# Patient Record
Sex: Female | Born: 1985 | Race: White | Hispanic: No | Marital: Married | Smoking: Never smoker
Health system: Southern US, Community
[De-identification: ages and names within clinical notes are randomized; demographics above are authoritative.]

## PROBLEM LIST (undated history)

## (undated) DIAGNOSIS — F329 Major depressive disorder, single episode, unspecified: Secondary | ICD-10-CM

## (undated) DIAGNOSIS — IMO0002 Reserved for concepts with insufficient information to code with codable children: Secondary | ICD-10-CM

## (undated) DIAGNOSIS — R87619 Unspecified abnormal cytological findings in specimens from cervix uteri: Secondary | ICD-10-CM

## (undated) DIAGNOSIS — K219 Gastro-esophageal reflux disease without esophagitis: Secondary | ICD-10-CM

## (undated) DIAGNOSIS — E282 Polycystic ovarian syndrome: Secondary | ICD-10-CM

## (undated) DIAGNOSIS — F32A Depression, unspecified: Secondary | ICD-10-CM

## (undated) HISTORY — PX: GYNECOLOGIC CRYOSURGERY: SHX857

## (undated) HISTORY — PX: WISDOM TOOTH EXTRACTION: SHX21

## (undated) HISTORY — PX: ADENOIDECTOMY: SUR15

## (undated) HISTORY — PX: TYMPANOSTOMY TUBE PLACEMENT: SHX32

## (undated) HISTORY — PX: TONSILLECTOMY: SUR1361

---
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
## 2003-02-23 ENCOUNTER — Other Ambulatory Visit: Admission: RE | Admit: 2003-02-23 | Discharge: 2003-02-23 | Payer: Self-pay | Admitting: Obstetrics and Gynecology

## 2003-06-17 ENCOUNTER — Emergency Department (HOSPITAL_COMMUNITY): Admission: EM | Admit: 2003-06-17 | Discharge: 2003-06-18 | Payer: Self-pay | Admitting: Emergency Medicine

## 2003-06-18 ENCOUNTER — Inpatient Hospital Stay (HOSPITAL_COMMUNITY): Admission: AD | Admit: 2003-06-18 | Discharge: 2003-06-21 | Payer: Self-pay | Admitting: Psychiatry

## 2003-09-06 ENCOUNTER — Other Ambulatory Visit: Admission: RE | Admit: 2003-09-06 | Discharge: 2003-09-06 | Payer: Self-pay | Admitting: Obstetrics and Gynecology

## 2003-11-09 ENCOUNTER — Other Ambulatory Visit: Admission: RE | Admit: 2003-11-09 | Discharge: 2003-11-09 | Payer: Self-pay | Admitting: Obstetrics and Gynecology

## 2004-07-17 ENCOUNTER — Other Ambulatory Visit: Admission: RE | Admit: 2004-07-17 | Discharge: 2004-07-17 | Payer: Self-pay | Admitting: Obstetrics and Gynecology

## 2004-08-28 ENCOUNTER — Other Ambulatory Visit: Admission: RE | Admit: 2004-08-28 | Discharge: 2004-08-28 | Payer: Self-pay | Admitting: Obstetrics and Gynecology

## 2004-11-11 ENCOUNTER — Other Ambulatory Visit: Admission: RE | Admit: 2004-11-11 | Discharge: 2004-11-11 | Payer: Self-pay | Admitting: Obstetrics and Gynecology

## 2005-03-25 ENCOUNTER — Other Ambulatory Visit: Admission: RE | Admit: 2005-03-25 | Discharge: 2005-03-25 | Payer: Self-pay | Admitting: Obstetrics and Gynecology

## 2005-06-10 ENCOUNTER — Other Ambulatory Visit: Admission: RE | Admit: 2005-06-10 | Discharge: 2005-06-10 | Payer: Self-pay | Admitting: Obstetrics and Gynecology

## 2005-11-17 ENCOUNTER — Other Ambulatory Visit: Admission: RE | Admit: 2005-11-17 | Discharge: 2005-11-17 | Payer: Self-pay | Admitting: Obstetrics & Gynecology

## 2006-11-09 ENCOUNTER — Other Ambulatory Visit: Admission: RE | Admit: 2006-11-09 | Discharge: 2006-11-09 | Payer: Self-pay | Admitting: Obstetrics and Gynecology

## 2007-05-23 ENCOUNTER — Other Ambulatory Visit: Admission: RE | Admit: 2007-05-23 | Discharge: 2007-05-23 | Payer: Self-pay | Admitting: Obstetrics and Gynecology

## 2007-12-14 ENCOUNTER — Ambulatory Visit (HOSPITAL_COMMUNITY): Admission: RE | Admit: 2007-12-14 | Discharge: 2007-12-14 | Payer: Self-pay | Admitting: Obstetrics and Gynecology

## 2008-05-12 ENCOUNTER — Inpatient Hospital Stay (HOSPITAL_COMMUNITY): Admission: AD | Admit: 2008-05-12 | Discharge: 2008-05-13 | Payer: Self-pay | Admitting: Obstetrics and Gynecology

## 2008-06-02 ENCOUNTER — Inpatient Hospital Stay (HOSPITAL_COMMUNITY): Admission: AD | Admit: 2008-06-02 | Discharge: 2008-06-04 | Payer: Self-pay | Admitting: Obstetrics and Gynecology

## 2008-07-12 ENCOUNTER — Ambulatory Visit (HOSPITAL_COMMUNITY): Admission: RE | Admit: 2008-07-12 | Discharge: 2008-07-12 | Payer: Self-pay | Admitting: *Deleted

## 2010-01-12 ENCOUNTER — Emergency Department (HOSPITAL_COMMUNITY): Admission: EM | Admit: 2010-01-12 | Discharge: 2010-01-12 | Payer: Self-pay | Admitting: Emergency Medicine

## 2010-12-16 NOTE — Op Note (Signed)
Jennifer Gamble, Jennifer Gamble            ACCOUNT NO.:  [id number]   MEDICAL RECORD NO.:  [id number]          PATIENT TYPE:  AMB   LOCATION:  ENDO                         FACILITY:  [facility]   PHYSICIAN:  Georgiana Spinner, M.D.    DATE OF BIRTH:  [birth year]   DATE OF PROCEDURE:  07/12/2008  DATE OF DISCHARGE:                               OPERATIVE REPORT   PROCEDURE:  Upper endoscopy.   INDICATIONS:  Abdominal pain.   ANESTHESIA:  Fentanyl 100 mcg, Versed 7 mg.   PROCEDURE IN DETAIL:  With the patient mildly sedated in the left  lateral decubitus position the Pentax videoscopic endoscope was inserted  in the mouth, passed under direct vision through the esophagus which  appeared normal into the stomach, fundus, body, antrum, duodenal bulb,  second portion duodenum appeared normal.  From this point the endoscope  was slowly withdrawn taking circumferential views of duodenal mucosa  until the endoscope had been pulled back into stomach and placed in  retroflexion to view the stomach from below.  The endoscope was  straightened and withdrawn taking circumferential views of remaining  gastric and esophageal mucosa.  The patient's vital signs, pulse  oximeter remained stable.  The patient tolerated procedure well without  apparent complications.   FINDINGS:  Negative examination.   PLAN:  Have the patient follow up with me as an outpatient.           ______________________________  Georgiana Spinner, M.D.     GMO/MEDQ  D:  07/12/2008  T:  07/12/2008  Job:  [id number]

## 2010-12-16 NOTE — Discharge Summary (Signed)
Jennifer Gamble, Jennifer Gamble            ACCOUNT NO.:  [id number]   MEDICAL RECORD NO.:  [id number]          PATIENT TYPE:  INP   LOCATION:  9140                          FACILITY:  [facility]   PHYSICIAN:  Gerrit Friends. Aldona Bar, M.D.   DATE OF BIRTH:  October 17, 1985   DATE OF ADMISSION:  06/02/2008  DATE OF DISCHARGE:  06/04/2008                               DISCHARGE SUMMARY   DISCHARGE DIAGNOSES:  1. A 37-week intrauterine pregnancy delivered viable 7 pounds 1 ounce      female infant, Apgars 6 and 7.  2. Blood type O positive.  3. Positive group B strep.  4. Viral syndrome.   PROCEDURES:  1. Intrapartum antibiotics.  2. Normal spontaneous delivery.  3. Midline episiotomy repair.   SUMMARY:  This 25 year old primigravida was admitted at 39-37 weeks'  gestation with fever and flu-like symptoms having previously been  started on Tamiflu as an outpatient.  She presented with spontaneous  rupture of membranes and some upper respiratory infection symptoms as  well as fever and chills.  She was monitored throughout labor and  subsequently delivered by normal spontaneous delivery of viable female  infant with Apgar of 6 and  - baby weighed 7 pounds 1 ounce and  initially had some grunting, but otherwise subsequently did well.  She  did receive intrapartum penicillin for positive group B strep while on  labor.   Her postpartum course was relatively benign.  She remained afebrile  postpartum and baby likewise did well.  On the morning of June 04, 2008, both mother and baby were doing well.  Baby was circumcised and  pending discharge and mother was discharged as well.  Discharge  hemoglobin on June 03, 2008, was 12.4 with a white count of 8400,  and platelet count of 208,000.   The patient was given a discharge brochure at the time of discharge and  understood all instructions well.  She will finish up her vitamins.  She  will continue on her Zoloft 100 mg daily as well as Tamiflu 75 mg twice  daily until gone.  She was also given a prescription for Motrin 600 mg  to use every 6 hours as needed for cramping or pain.  She was given  instruction brochure at the time of discharge and understood all  instructions well.   CONDITION ON DISCHARGE:  Improved.      Gerrit Friends. Aldona Bar, M.D.  Electronically Signed     RMW/MEDQ  D:  06/04/2008  T:  06/04/2008  Job:  [id number]

## 2010-12-19 NOTE — Discharge Summary (Signed)
NAMESHACOLA, SCHUSSLER                        ACCOUNT NO.:  [id number]   MEDICAL RECORD NO.:  [id number]                   PATIENT TYPE:  IPS   LOCATION:  0305                                 FACILITY:  [facility]   PHYSICIAN:  Geoffery Lyons, M.D.                   DATE OF BIRTH:  [birth year]   DATE OF ADMISSION:  06/18/2003  DATE OF DISCHARGE:  06/21/2003                                 DISCHARGE SUMMARY   CHIEF COMPLAINT AND PRESENT ILLNESS:  This was the first admission to [facility] for this 25 year old single white female  voluntarily admitted.  History of intentional overdose, overdosing on 800-  900 mg of Seroquel tablets.  She was at home, it was an impulsive act.  After her overdose she felt very scared and called EMS.  Increasing mood  swings.  Feels very hopeless, irritated state, empty feeling, issues with  her boyfriend.  Diagnosed with bipolar disorder.  Normally sleeps well when  she takes the Seroquel.  Some carbohydrate cravings and sweets.   PAST PSYCHIATRIC HISTORY:  First time hospitalized.  No history of other  suicide attempt.  She saw Dr. Onalee Hua L. Toni Arthurs.  History of bipolar disorder.   ALCOHOL AND DRUG HISTORY:  Denies the use or abuse of any substances.   PAST MEDICAL HISTORY:  Noncontributory.   MEDICATIONS:  1. Lamictal 150 for the past 2 months.  2. Albuterol inhaler.  3. Seroquel 25 at night.  4. Lexapro 20 per day.   PHYSICAL EXAMINATION:  Performed and failed show any acute findings.   MENTAL STATUS EXAM:  Reveals an alert young cooperative female casually  dressed, fair eye contact.  Speech was clear.  She felt ashamed and  embarrassed.  Agreeable and somewhat sad.  Thought processes were coherent.  Goal-directed.  No evidence of psychosis.  Cognition well preserved.   ADMISSION DIAGNOSIS:   AXIS I:  Rule out bipolar disorder.   AXIS II:  No diagnosis.   AXIS III:  No diagnosis.   AXIS IV:  Moderate.   AXIS V:  Global  assessment of functioning upon admission  35; highest global  assessment of functioning in the last year:  65-70.   LABORATORY WORK-UP:  Sodium 138.  Potassium 3.4.  glucose 138.  Alcohol  level less than 5.  Urine pregnant test negative.  Urine drug screen  negative for substances of abuse.   HOSPITAL COURSE:  She was admitted and started in intensive individual and  group psychotherapy.  She was maintained on Lexapro 50 mg per day, Lamictal  150 mg per day, Seroquel 25 daily, and every 4 hours as needed, Xanax 0.25  every 4 hours as needed.  She was preclude on albuterol and was she was  given Ambien for sleep.  She endorsed mood swings.  She had a bad response  to Paxil with increase in the mood  swings.  Family history of bipolar  disorder.  Still with episodes of irritability and impulsivity and racing  thoughts.  We went ahead and discussed options and we started lithium 300 mg  twice a day.  This was increased up to 900 mg per day.  She was concerned  about getting behind in school being that she is missing school because of  being in the hospital.  Mood was not so good.  Having a hard time being  apart from the family.  Denied any suicidal or homicidal ideation.   On June 21, 2003 she was in full contact with reality.  No suicidal  ideation.  No homicidal ideation.  No delusions.  No hallucinations.  Feeling much better.  Actually well.  No mood swings.  Slept well.  Willing to pursue further outpatient treatments.  So, we went ahead and  discharged to outpatient followup.   DISCHARGE DIAGNOSES:   AXIS I:  1. Bipolar disorder.  2. Depression.   AXIS II:  No diagnosis.   AXIS III:  No diagnosis.   AXIS IV:  Moderate.   AXIS V:  Global assessment of functioning on discharge:  55.   DISCHARGE MEDICATIONS:  1. Lexapro 50 mg per day.  2. Advair inhaler.  3. Lamictal 175 mg daily.  4. Seroquel 50 mg at night.  5. Lithium carbonate 300 mg three times a day.  6.  Ambien 10 at bedtime for sleep.   FOLLOW UP:  Dr. Toni Arthurs.                                               Geoffery Lyons, M.D.    IL/MEDQ  D:  07/11/2003  T:  07/12/2003  Job:  [id number]

## 2010-12-19 NOTE — H&P (Signed)
NAMECORBY, VILLASENOR                        ACCOUNT NO.:  [id number]   MEDICAL RECORD NO.:  [id number]                   PATIENT TYPE:  IPS   LOCATION:  0305                                 [facility]ILITY[facility]   PHYSICIAN:  Geoffery Lyons, M.D.                   DATE OF BIRTH:  1986/01/20   DATE OF ADMISSION:  06/18/2003  DATE OF DISCHARGE:                         PSYCHIATRIC ADMISSION ASSESSMENT   IDENTIFYING INFORMATION:  The patient is a 25 year old single white female  voluntarily admitted on June 18, 2003.   HISTORY OF PRESENT ILLNESS:  The patient presents with a history of  intentional overdose, overdosing on eight to nine 100 mg Seroquel tablets.  She was at home.  She states it was an impulsive act.  She states after she  overdosed she felt very scared and called EMS.  The patient reports having  increasing mood swings, feels very hopeless, irritated, sad, empty feeling.  Her stressors include issues with a boyfriend, recently diagnosed with  bipolar disorder.  The patient states she normally sleeps well when she  takes Seroquel.  Her appetite: The patient reports some cravings with  carbohydrates and sweets.  She denies hallucinations.  She denies any  current suicide attempt at this time.   PAST PSYCHIATRIC HISTORY:  This is the first hospitalization to [facility].  No history of a suicide attempt.  No other psychiatric  admissions.  She sees Onalee Hua L. Toni Arthurs, M.D., a psychologist in [place].  She has a history of bipolar disorder.   SUBSTANCE ABUSE HISTORY:  The patient is a nonsmoker.  She denies any  alcohol or drug use.   PAST MEDICAL HISTORY:  Primary care Aizlyn Schifano: Dr. Rebecca Eaton in [place].  Medical problems: Asthma.   MEDICATIONS:  1. She has been Lamictal 150 mg for the past two months.  2. Albuterol inhaler.  3. Seroquel 25 mg at h.s.; has been on that for approximately five months.  4. Lexapro 20 mg; has been on it for four months.   She  has been off her medications for three to four days.   DRUG ALLERGIES:  No known allergies.   PHYSICAL EXAMINATION:  GENERAL:  Physical examination was done at [facility], which was reviewed with no significant findings.  The patient is a well nourished female in no acute distress.  VITAL SIGNS:  Vital signs are stable at temperature 97.8, heart rate 83,  respirations 18, blood pressure 120/83.  She is 5 feet 5 inches tall.  She  156 pounds.   LABORATORY DATA:  Sodium 138, potassium 3.4, glucose 134.  Alcohol level:  Less than 5.  Acetaminophen level: Less than 10.  Salicylate level: Less  than 4.  Urine pregnancy test was negative.  Urine drug screen was negative.   SOCIAL HISTORY:  She is a 25 year old single African-American female with no  children.  She lives with her  parents and her brother.  No legal problems.  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  She is currently working in  an Educational psychologist clinic as a Scientist, physiological.   FAMILY HISTORY:  Her 46 year old brother has a history of bipolar disorder,  was diagnosed two years ago.  He is currently on lithium and Seroquel.   MENTAL STATUS EXAM:  She is an alert, young cooperative female, casually  dressed, fair eye contact.  Speech is clear.  The patient feels very ashamed  and embarrassed.  The patient is agreeable and somewhat sad.  Thought  processes are coherent; goal directed; no evidence of psychosis.  Cognitive  functioning: Intact.  Memory is fair.  Judgment is fair.  Poor impulse  control.   ADMISSION DIAGNOSES:   AXIS I:  Rule out bipolar disorder.   AXIS II:  Deferred.   AXIS III:  None.   AXIS IV:  Problems with education and other psychosocial problems related to  recent psychiatric illness.   AXIS V:  Current is 35, this past year is 65-70.   INITIAL PLAN OF CARE:  Plan is a voluntary admission to [facility] for intentional overdose.  Contract for safety, check every  15  minutes.  Stabilize mood and thinking so the patient can be safe and  functional.  Will decrease Lexapro as it may have added to the patient's  impulsivity.  Continue to educate the patient on diagnosis and medications.  The patient is to be medication compliant.  The patient is to follow up with  Onalee Hua L. Toni Arthurs, M.D.  Will consider a family session with parents prior to  discharge.   ESTIMATED LENGTH OF STAY:  Three to five days.     Landry Corporal, N.P.                       Geoffery Lyons, M.D.    JO/MEDQ  D:  06/19/2003  T:  06/19/2003  Job:  [id number]

## 2011-04-07 ENCOUNTER — Ambulatory Visit (HOSPITAL_BASED_OUTPATIENT_CLINIC_OR_DEPARTMENT_OTHER): Admission: RE | Admit: 2011-04-07 | Payer: 59 | Source: Ambulatory Visit | Admitting: Otolaryngology

## 2011-05-05 LAB — CBC
Hemoglobin: 11.9 — ABNORMAL LOW
Hemoglobin: 12.4
MCHC: 32.6
MCV: 87.3
RBC: 4.1
RBC: 4.35
RDW: 14.4

## 2011-06-08 ENCOUNTER — Inpatient Hospital Stay (HOSPITAL_COMMUNITY)
Admission: AD | Admit: 2011-06-08 | Discharge: 2011-06-08 | Disposition: A | Discharge status: Home / Self Care | Payer: PRIVATE HEALTH INSURANCE | Source: Ambulatory Visit | Attending: Obstetrics and Gynecology | Admitting: Obstetrics and Gynecology

## 2011-06-08 ENCOUNTER — Inpatient Hospital Stay (HOSPITAL_COMMUNITY): Payer: PRIVATE HEALTH INSURANCE

## 2011-06-08 ENCOUNTER — Encounter (HOSPITAL_COMMUNITY): Payer: Self-pay | Admitting: *Deleted

## 2011-06-08 DIAGNOSIS — N949 Unspecified condition associated with female genital organs and menstrual cycle: Secondary | ICD-10-CM

## 2011-06-08 DIAGNOSIS — R102 Pelvic and perineal pain: Secondary | ICD-10-CM

## 2011-06-08 DIAGNOSIS — N390 Urinary tract infection, site not specified: Secondary | ICD-10-CM

## 2011-06-08 HISTORY — DX: Reserved for concepts with insufficient information to code with codable children: IMO0002

## 2011-06-08 HISTORY — DX: Gastro-esophageal reflux disease without esophagitis: K21.9

## 2011-06-08 HISTORY — DX: Major depressive disorder, single episode, unspecified: F32.9

## 2011-06-08 HISTORY — DX: Unspecified abnormal cytological findings in specimens from cervix uteri: R87.619

## 2011-06-08 HISTORY — DX: Depression, unspecified: F32.A

## 2011-06-08 HISTORY — DX: Polycystic ovarian syndrome: E28.2

## 2011-06-08 LAB — URINALYSIS, ROUTINE W REFLEX MICROSCOPIC
Glucose, UA: NEGATIVE mg/dL
Hgb urine dipstick: NEGATIVE
Ketones, ur: NEGATIVE mg/dL
Protein, ur: NEGATIVE mg/dL
Urobilinogen, UA: 0.2 mg/dL (ref 0.0–1.0)

## 2011-06-08 LAB — URINE MICROSCOPIC-ADD ON

## 2011-06-08 LAB — WET PREP, GENITAL
Clue Cells Wet Prep HPF POC: NONE SEEN
Trich, Wet Prep: NONE SEEN

## 2011-06-08 MED ORDER — SULFAMETHOXAZOLE-TRIMETHOPRIM 800-160 MG PO TABS: 1.0000 | ORAL_TABLET | Freq: Two times a day (BID) | ORAL | Status: AC

## 2011-06-08 MED ORDER — KETOROLAC TROMETHAMINE 60 MG/2ML IM SOLN
60.0000 mg | Freq: Once | INTRAMUSCULAR | Status: AC
  Administered 2011-06-08: 60 mg via INTRAMUSCULAR
  Filled 2011-06-08: qty 2

## 2011-06-08 NOTE — Progress Notes (Signed)
Fri night during petting, had sharp vag pains.  Cont over the weekend, abd feels bloated and has tender. Vag pain continues. Hurts to sit, radiates, very tender to touch.  Feels similar to when had a uterine infection after IUD placed.

## 2011-06-08 NOTE — ED Provider Notes (Signed)
History     CSN: [id number] Arrival date & time: 06/08/2011 11:20 AM   None     Chief Complaint  Patient presents with  . Vaginal Pain    HPI Jennifer Gamble is a 25 y.o. female who presents to [facility] for vaginal pain that started 3 days ago when having sex and has continued. Also having lower abdominal cramping. Had pelvic infection when had IUD and this pain in the lower abdomen feels similar to that. Last sexual intercourse before 3 days ago was 3 months before that. Denies discharge or bleeding. Condoms for birth control, LMP 05/23/11. The history was provided by the patient.  Past Medical History  Diagnosis Date  . Ulcer   . Acid reflux   . Depression   . Abnormal Pap smear     CIN II  . Polycystic ovarian disease     Past Surgical History  Procedure Date  . Gynecologic cryosurgery   . Wisdom tooth extraction     Family History  Problem Relation Age of Onset  . Diabetes Mother   . Hypertension Mother     History  Substance Use Topics  . Smoking status: Never Smoker   . Smokeless tobacco: Never Used  . Alcohol Use: 1.0 oz/week    2 drink(s) per week    OB History    Grav Para Term Preterm Abortions TAB SAB Ect Mult Living   2 1 1  0 1 1 0 0 0 1      Review of Systems  Constitutional: Positive for chills. Negative for fever, diaphoresis and fatigue.  HENT: Negative for ear pain, congestion, sore throat, facial swelling, neck pain, neck stiffness, dental problem and sinus pressure.   Eyes: Negative for photophobia, pain and discharge.  Respiratory: Negative for cough, chest tightness and wheezing.   Cardiovascular: Negative.   Gastrointestinal: Positive for nausea, abdominal pain and abdominal distention. Negative for vomiting, diarrhea and constipation.  Genitourinary: Positive for vaginal bleeding. Negative for dysuria, frequency, flank pain, vaginal discharge and difficulty urinating.  Musculoskeletal: Positive for back pain. Negative for myalgias and  gait problem.  Skin: Negative for color change and rash.  Neurological: Positive for light-headedness. Negative for dizziness, speech difficulty, weakness, numbness and headaches.  Psychiatric/Behavioral: Negative for confusion and agitation. The patient is not nervous/anxious.        Depression    Allergies  Review of patient's allergies indicates no known allergies.  Home Medications  No current outpatient prescriptions on file.  BP 119/87  Pulse 77  Temp(Src) 98.5 F (36.9 C) (Oral)  Resp 20  LMP 05/18/2011  Physical Exam  Nursing note and vitals reviewed. Constitutional: She is oriented to person, place, and time. She appears well-developed and well-nourished.  HENT:  Head: Normocephalic.  Eyes: EOM are normal.  Neck: Neck supple.  Cardiovascular: Normal rate.   Pulmonary/Chest: Effort normal.  Abdominal: Soft. There is tenderness.       Mildly tender RLQ without guarding or rebound.  Genitourinary:       External genitalia without lesions. Small amount of white vaginal discharge. Right adnexal tenderness, uterus without palpable enlargement.  Musculoskeletal: Normal range of motion.  Neurological: She is alert and oriented to person, place, and time. No cranial nerve deficit.  Skin: Skin is warm and dry.  Psychiatric: Judgment and thought content normal. Her mood appears anxious.   Results for orders placed during the hospital encounter of 06/08/11 (from the past 24 hour(s))  URINALYSIS, ROUTINE W REFLEX MICROSCOPIC  Status: Abnormal   Collection Time   06/08/11 12:25 PM      Component Value Range   Color, Urine YELLOW  YELLOW    Appearance CLOUDY (*) CLEAR    Specific Gravity, Urine 1.025  1.005 - 1.030    pH 6.0  5.0 - 8.0    Glucose, UA NEGATIVE  NEGATIVE (mg/dL)   Hgb urine dipstick NEGATIVE  NEGATIVE    Bilirubin Urine NEGATIVE  NEGATIVE    Ketones, ur NEGATIVE  NEGATIVE (mg/dL)   Protein, ur NEGATIVE  NEGATIVE (mg/dL)   Urobilinogen, UA 0.2  0.0 - 1.0  (mg/dL)   Nitrite POSITIVE (*) NEGATIVE    Leukocytes, UA NEGATIVE  NEGATIVE   URINE MICROSCOPIC-ADD ON     Status: Abnormal   Collection Time   06/08/11 12:25 PM      Component Value Range   Squamous Epithelial / LPF FEW (*) RARE    WBC, UA 0-2  <3 (WBC/hpf)   Bacteria, UA MANY (*) RARE    Urine-Other MUCOUS PRESENT    WET PREP, GENITAL     Status: Abnormal   Collection Time   06/08/11 12:26 PM      Component Value Range   Yeast, Wet Prep NONE SEEN  NONE SEEN    Trich, Wet Prep NONE SEEN  NONE SEEN    Clue Cells, Wet Prep NONE SEEN  NONE SEEN    WBC, Wet Prep HPF POC FEW (*) NONE SEEN   POCT PREGNANCY, URINE     Status: Normal   Collection Time   06/08/11  2:49 PM      Component Value Range   Preg Test, Ur NEGATIVE     US Transvaginal Non-ob  06/08/2011  *RADIOLOGY REPORT*  Clinical Data: Pelvic pain.  Pelvic fullness.  No prior surgeries.  TRANSABDOMINAL AND TRANSVAGINAL ULTRASOUND OF PELVIS Technique:  Both transabdominal and transvaginal ultrasound examinations of the pelvis were performed. Transabdominal technique was performed for global imaging of the pelvis including uterus, ovaries, adnexal regions, and pelvic cul-de-sac.  Comparison: None.   It was necessary to proceed with endovaginal exam following the transabdominal exam to visualize the uterus and ovaries.  Findings:  Uterus: The uterus is 7.6 x 4.1 x 5.1 cm.  No masses identified.  Endometrium: The endometrium is 7.2 mm in thickness, normal in appearance.  Right ovary:  The right ovary is 3.0 x 2.3 x 2.1 cm. The ovary has a normal appearance.  Left ovary: The left ovary is 2.9 x 2.5 x 2.9 cm.  The ovary has a normal appearance.  Other findings: There is a small amount of free pelvic fluid.  IMPRESSION: Normal study. No evidence of pelvic mass or other significant abnormality.  Original Report Authenticated By: Patterson Hammersmith, M.D.   US Pelvis Complete  06/08/2011  *RADIOLOGY REPORT*  Clinical Data: Pelvic pain.  Pelvic  fullness.  No prior surgeries.  TRANSABDOMINAL AND TRANSVAGINAL ULTRASOUND OF PELVIS Technique:  Both transabdominal and transvaginal ultrasound examinations of the pelvis were performed. Transabdominal technique was performed for global imaging of the pelvis including uterus, ovaries, adnexal regions, and pelvic cul-de-sac.  Comparison: None.   It was necessary to proceed with endovaginal exam following the transabdominal exam to visualize the uterus and ovaries.  Findings:  Uterus: The uterus is 7.6 x 4.1 x 5.1 cm.  No masses identified.  Endometrium: The endometrium is 7.2 mm in thickness, normal in appearance.  Right ovary:  The right ovary is 3.0 x 2.3  x 2.1 cm. The ovary has a normal appearance.  Left ovary: The left ovary is 2.9 x 2.5 x 2.9 cm.  The ovary has a normal appearance.  Other findings: There is a small amount of free pelvic fluid.  IMPRESSION: Normal study. No evidence of pelvic mass or other significant abnormality.  Original Report Authenticated By: Patterson Hammersmith, M.D.   Assessment: UTI  Plan:  Antibiotics   Pyridium   Follow up with [facility]  ED Course  Procedures   MDM          Kerrie Buffalo, NP 06/10/11 [phone]

## 2011-06-08 NOTE — Progress Notes (Signed)
Denies urinary symptoms, no pain, burning, frequency or urgency.

## 2011-06-11 NOTE — ED Provider Notes (Signed)
Agree with above note.  Jennifer Gamble 06/11/2011 9:11 AM

## 2011-07-30 ENCOUNTER — Other Ambulatory Visit: Payer: Self-pay | Admitting: Internal Medicine

## 2011-07-30 ENCOUNTER — Ambulatory Visit
Admission: RE | Admit: 2011-07-30 | Discharge: 2011-07-30 | Disposition: A | Discharge status: Home / Self Care | Payer: PRIVATE HEALTH INSURANCE | Source: Ambulatory Visit | Attending: Internal Medicine | Admitting: Internal Medicine

## 2011-07-30 DIAGNOSIS — R102 Pelvic and perineal pain: Secondary | ICD-10-CM

## 2011-09-03 ENCOUNTER — Other Ambulatory Visit: Payer: Self-pay | Admitting: Obstetrics and Gynecology

## 2011-09-03 DIAGNOSIS — N6320 Unspecified lump in the left breast, unspecified quadrant: Secondary | ICD-10-CM

## 2011-09-10 ENCOUNTER — Ambulatory Visit
Admission: RE | Admit: 2011-09-10 | Discharge: 2011-09-10 | Disposition: A | Discharge status: Home / Self Care | Payer: Self-pay | Source: Ambulatory Visit | Attending: Obstetrics and Gynecology | Admitting: Obstetrics and Gynecology

## 2011-09-10 DIAGNOSIS — N6320 Unspecified lump in the left breast, unspecified quadrant: Secondary | ICD-10-CM

## 2012-03-28 ENCOUNTER — Encounter: Payer: Self-pay | Admitting: Obstetrics and Gynecology

## 2012-03-28 ENCOUNTER — Ambulatory Visit (INDEPENDENT_AMBULATORY_CARE_PROVIDER_SITE_OTHER): Payer: Self-pay | Admitting: Obstetrics and Gynecology

## 2012-03-28 VITALS — BP 120/70 | Wt 182.0 lb

## 2012-03-28 DIAGNOSIS — IMO0002 Reserved for concepts with insufficient information to code with codable children: Secondary | ICD-10-CM

## 2012-03-28 DIAGNOSIS — R87612 Low grade squamous intraepithelial lesion on cytologic smear of cervix (LGSIL): Secondary | ICD-10-CM

## 2012-03-28 NOTE — Progress Notes (Signed)
Pt is here for repeat pap. 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 Normal colpo 09/2011  REPEAT PAP VISIT  Subjective:    Jennifer Gamble is a 26 y.o. female who presents for a  repeat pap    Objective:    External genitalia: normal Perianal skin: no external genital warts noted Vagina: normal without discharge Cervix: normal in appearance  Assessment and Plan:   Abnormal pap or biopsy demonstrating LGSIL  Pap # 2/3 performed Return to the office in 4 months for repeat Pap until 3 consecutive normal Pap Smears Continue Folic Acid 1 mg daily  Anjanette Gilkey AMD 8/26/201311:49 AM

## 2012-03-30 LAB — PAP IG W/ RFLX HPV ASCU

## 2012-04-01 ENCOUNTER — Telehealth: Payer: Self-pay | Admitting: Obstetrics and Gynecology

## 2012-04-01 NOTE — Telephone Encounter (Signed)
LAURA/REROUTED TO SR CNA

## 2012-04-01 NOTE — Telephone Encounter (Signed)
sr pt 

## 2012-04-06 NOTE — Telephone Encounter (Signed)
Needs another option for Nuva ring please.  Will send chart to SR office.  ld

## 2012-04-11 NOTE — Telephone Encounter (Signed)
There is a discount on [email or web address] If not, need chart or other BCP pt has used in the past to make recommendations

## 2012-04-12 ENCOUNTER — Telehealth: Payer: Self-pay

## 2012-04-12 NOTE — Telephone Encounter (Signed)
LM for pt that a discount card is available on [email or web address].  Pt to call if that won't work for her.  ld

## 2012-07-20 ENCOUNTER — Encounter: Payer: Self-pay | Admitting: Obstetrics and Gynecology

## 2012-08-12 ENCOUNTER — Encounter: Payer: Self-pay | Admitting: Obstetrics and Gynecology

## 2012-08-30 ENCOUNTER — Ambulatory Visit: Payer: PRIVATE HEALTH INSURANCE | Admitting: Obstetrics and Gynecology

## 2012-08-30 ENCOUNTER — Encounter: Payer: Self-pay | Admitting: Obstetrics and Gynecology

## 2012-08-30 VITALS — BP 114/66 | Ht 65.0 in | Wt 185.0 lb

## 2012-08-30 DIAGNOSIS — Z0189 Encounter for other specified special examinations: Secondary | ICD-10-CM

## 2012-08-30 DIAGNOSIS — Z01419 Encounter for gynecological examination (general) (routine) without abnormal findings: Secondary | ICD-10-CM

## 2012-08-30 NOTE — Progress Notes (Signed)
History of previous cervical treatment:  Colpo Last pap showed:Normal     Date 03/28/2012 High Risk HPV present: No   Colposcopy results:Normal    Date:   09/2011  Gardasil received: yes  Offered:  no Tobacco use:  No Using Folic Acid: yes  The patient reports: c/o recurrent yeast infections. Has began using RePHresh x 6-7 weeks  Contraception:condoms  Last mammogram: not applicable * Last pap: approximate date 03/28/2013 and was normal   GC/Chlamydia cultures offered: declined HIV/RPR/HbsAg offered:  declined HSV 1 and 2 glycoprotein offered: declined  Menstrual cycle regular and monthly: Yes Menstrual flow normal: Yes  Urinary symptoms: none Normal bowel movements: Yes Reports abuse at home: No:  Subjective:    Jennifer Gamble is a 27 y.o. female, G2P1011, who presents for an annual exam.     History   Social History  . Marital Status: Legally Separated    Spouse Name: N/A    Number of Children: N/A  . Years of Education: N/A   Social History Main Topics  . Smoking status: Never Smoker   . Smokeless tobacco: Never Used  . Alcohol Use: 1.0 oz/week    2 drink(s) per week  . Drug Use: No  . Sexually Active: Yes    Birth Control/ Protection: Condom   Other Topics Concern  . None   Social History Narrative  . None    Menstrual cycle:   LMP: Patient's last menstrual period was 08/11/2012.           Cycle: Normal  The following portions of the patient's history were reviewed and updated as appropriate: allergies, current medications, past family history, past medical history, past social history, past surgical history and problem list.  Review of Systems Pertinent items are noted in HPI. Breast:Negative for breast lump,nipple discharge or nipple retraction Gastrointestinal: Negative for abdominal pain, change in bowel habits or rectal bleeding Urinary:negative   Objective:    BP 114/66  Ht 5\' 5"  (1.651 m)  Wt 185 lb (83.915 kg)  BMI 30.79 kg/m2  LMP  08/11/2012    Weight:  Wt Readings from Last 1 Encounters:  08/30/12 185 lb (83.915 kg)          BMI: Body mass index is 30.79 kg/(m^2).  General Appearance: Alert, appropriate appearance for age. No acute distress HEENT: Grossly normal Neck / Thyroid: Supple, no masses, nodes or enlargement Lungs: clear to auscultation bilaterally Back: No CVA tenderness Breast Exam: No dimpling, nipple retraction or discharge. No masses or nodes. and No masses or nodes.No dimpling, nipple retraction or discharge. Cardiovascular: Regular rate and rhythm. S1, S2, no murmur Gastrointestinal: Soft, non-tender, no masses or organomegaly Pelvic Exam: Vulva and vagina appear normal. Bimanual exam reveals normal uterus and adnexa. Rectovaginal: normal rectal, no masses Lymphatic Exam: Non-palpable nodes in neck, clavicular, axillary, or inguinal regions Skin: no rash or abnormalities Neurologic: Normal gait and speech, no tremor  Psychiatric: Alert and oriented, appropriate affect.   Assessment:    Normal gyn exam  LGSIL   Plan:    mammogram pap smear #3/3 return annually or prn STD screening: declined Contraception:condoms Evening Primrose Oil Reccommended    Silverio Lay MD

## 2012-08-31 LAB — PAP IG W/ RFLX HPV ASCU

## 2012-09-27 ENCOUNTER — Telehealth: Payer: Self-pay | Admitting: Obstetrics and Gynecology

## 2012-09-27 NOTE — Telephone Encounter (Signed)
Pt c/o yeast infection advised need appt pt has appt 09/28/12 at 2:00 with LC pt voice understanding

## 2012-09-27 NOTE — Telephone Encounter (Signed)
Lm on vm tcb rgd msg 

## 2012-09-28 ENCOUNTER — Encounter: Payer: PRIVATE HEALTH INSURANCE | Admitting: Family Medicine

## 2014-06-04 ENCOUNTER — Encounter: Payer: Self-pay | Admitting: Obstetrics and Gynecology

## 2015-11-05 DIAGNOSIS — M25561 Pain in right knee: Secondary | ICD-10-CM | POA: Diagnosis not present

## 2015-11-05 DIAGNOSIS — M2241 Chondromalacia patellae, right knee: Secondary | ICD-10-CM | POA: Diagnosis not present

## 2015-11-05 DIAGNOSIS — M6281 Muscle weakness (generalized): Secondary | ICD-10-CM | POA: Diagnosis not present

## 2015-11-14 DIAGNOSIS — J029 Acute pharyngitis, unspecified: Secondary | ICD-10-CM | POA: Diagnosis not present

## 2015-11-14 DIAGNOSIS — J069 Acute upper respiratory infection, unspecified: Secondary | ICD-10-CM | POA: Diagnosis not present

## 2016-01-16 DIAGNOSIS — N898 Other specified noninflammatory disorders of vagina: Secondary | ICD-10-CM | POA: Diagnosis not present

## 2016-01-16 DIAGNOSIS — N94810 Vulvar vestibulitis: Secondary | ICD-10-CM | POA: Diagnosis not present

## 2016-05-27 DIAGNOSIS — R51 Headache: Secondary | ICD-10-CM | POA: Diagnosis not present

## 2016-08-04 DIAGNOSIS — R1030 Lower abdominal pain, unspecified: Secondary | ICD-10-CM | POA: Diagnosis not present

## 2016-08-12 DIAGNOSIS — R1030 Lower abdominal pain, unspecified: Secondary | ICD-10-CM | POA: Diagnosis not present

## 2016-08-24 ENCOUNTER — Other Ambulatory Visit: Payer: Self-pay | Admitting: Internal Medicine

## 2016-08-24 DIAGNOSIS — R1030 Lower abdominal pain, unspecified: Secondary | ICD-10-CM

## 2016-08-25 ENCOUNTER — Other Ambulatory Visit: Payer: Self-pay | Admitting: Gastroenterology

## 2016-08-25 DIAGNOSIS — R140 Abdominal distension (gaseous): Secondary | ICD-10-CM | POA: Diagnosis not present

## 2016-08-25 DIAGNOSIS — R1032 Left lower quadrant pain: Secondary | ICD-10-CM

## 2016-08-25 DIAGNOSIS — R194 Change in bowel habit: Secondary | ICD-10-CM | POA: Diagnosis not present

## 2016-08-25 DIAGNOSIS — R1033 Periumbilical pain: Secondary | ICD-10-CM | POA: Diagnosis not present

## 2016-08-26 ENCOUNTER — Other Ambulatory Visit: Payer: BLUE CROSS/BLUE SHIELD

## 2016-08-26 ENCOUNTER — Ambulatory Visit
Admission: RE | Admit: 2016-08-26 | Discharge: 2016-08-26 | Disposition: A | Discharge status: Home / Self Care | Payer: BLUE CROSS/BLUE SHIELD | Source: Ambulatory Visit | Attending: Gastroenterology | Admitting: Gastroenterology

## 2016-08-26 DIAGNOSIS — R1032 Left lower quadrant pain: Secondary | ICD-10-CM | POA: Diagnosis not present

## 2016-08-26 MED ORDER — IOPAMIDOL (ISOVUE-300) INJECTION 61%
100.0000 mL | Freq: Once | INTRAVENOUS | Status: AC | PRN
  Administered 2016-08-26: 100 mL via INTRAVENOUS

## 2016-10-14 DIAGNOSIS — D235 Other benign neoplasm of skin of trunk: Secondary | ICD-10-CM | POA: Diagnosis not present

## 2016-10-14 DIAGNOSIS — L811 Chloasma: Secondary | ICD-10-CM | POA: Diagnosis not present

## 2016-10-14 DIAGNOSIS — L819 Disorder of pigmentation, unspecified: Secondary | ICD-10-CM | POA: Diagnosis not present

## 2016-10-14 DIAGNOSIS — L858 Other specified epidermal thickening: Secondary | ICD-10-CM | POA: Diagnosis not present

## 2016-10-15 DIAGNOSIS — Z6828 Body mass index (BMI) 28.0-28.9, adult: Secondary | ICD-10-CM | POA: Diagnosis not present

## 2016-10-15 DIAGNOSIS — N898 Other specified noninflammatory disorders of vagina: Secondary | ICD-10-CM | POA: Diagnosis not present

## 2016-10-15 DIAGNOSIS — Z3040 Encounter for surveillance of contraceptives, unspecified: Secondary | ICD-10-CM | POA: Diagnosis not present

## 2016-10-15 DIAGNOSIS — Z01419 Encounter for gynecological examination (general) (routine) without abnormal findings: Secondary | ICD-10-CM | POA: Diagnosis not present

## 2016-10-15 DIAGNOSIS — Z124 Encounter for screening for malignant neoplasm of cervix: Secondary | ICD-10-CM | POA: Diagnosis not present

## 2016-11-27 DIAGNOSIS — N898 Other specified noninflammatory disorders of vagina: Secondary | ICD-10-CM | POA: Diagnosis not present

## 2016-12-16 DIAGNOSIS — Z6828 Body mass index (BMI) 28.0-28.9, adult: Secondary | ICD-10-CM | POA: Diagnosis not present

## 2016-12-16 DIAGNOSIS — N761 Subacute and chronic vaginitis: Secondary | ICD-10-CM | POA: Diagnosis not present

## 2016-12-21 DIAGNOSIS — Z0000 Encounter for general adult medical examination without abnormal findings: Secondary | ICD-10-CM | POA: Diagnosis not present

## 2017-04-12 DIAGNOSIS — Z6829 Body mass index (BMI) 29.0-29.9, adult: Secondary | ICD-10-CM | POA: Diagnosis not present

## 2017-04-12 DIAGNOSIS — N898 Other specified noninflammatory disorders of vagina: Secondary | ICD-10-CM | POA: Diagnosis not present

## 2017-04-12 DIAGNOSIS — N94819 Vulvodynia, unspecified: Secondary | ICD-10-CM | POA: Diagnosis not present

## 2017-04-13 DIAGNOSIS — J353 Hypertrophy of tonsils with hypertrophy of adenoids: Secondary | ICD-10-CM | POA: Diagnosis not present

## 2017-04-13 DIAGNOSIS — J3503 Chronic tonsillitis and adenoiditis: Secondary | ICD-10-CM | POA: Diagnosis not present

## 2017-07-09 DIAGNOSIS — L309 Dermatitis, unspecified: Secondary | ICD-10-CM | POA: Diagnosis not present

## 2017-07-09 DIAGNOSIS — L811 Chloasma: Secondary | ICD-10-CM | POA: Diagnosis not present

## 2017-07-19 DIAGNOSIS — Z6829 Body mass index (BMI) 29.0-29.9, adult: Secondary | ICD-10-CM | POA: Diagnosis not present

## 2017-07-19 DIAGNOSIS — N9489 Other specified conditions associated with female genital organs and menstrual cycle: Secondary | ICD-10-CM | POA: Diagnosis not present

## 2017-07-19 DIAGNOSIS — N94819 Vulvodynia, unspecified: Secondary | ICD-10-CM | POA: Diagnosis not present

## 2017-07-22 ENCOUNTER — Encounter: Payer: Self-pay | Admitting: Physical Therapy

## 2017-07-22 ENCOUNTER — Ambulatory Visit: Payer: BLUE CROSS/BLUE SHIELD | Attending: Obstetrics & Gynecology | Admitting: Physical Therapy

## 2017-07-22 DIAGNOSIS — G8929 Other chronic pain: Secondary | ICD-10-CM | POA: Diagnosis not present

## 2017-07-22 DIAGNOSIS — M62838 Other muscle spasm: Secondary | ICD-10-CM | POA: Diagnosis not present

## 2017-07-22 DIAGNOSIS — M6281 Muscle weakness (generalized): Secondary | ICD-10-CM | POA: Diagnosis not present

## 2017-07-22 DIAGNOSIS — M5442 Lumbago with sciatica, left side: Secondary | ICD-10-CM | POA: Insufficient documentation

## 2017-07-22 NOTE — Therapy (Signed)
[facility] [address], [address] [place], [place], [address] Phone: [phone]   Fax:  [phone]  Physical Therapy Evaluation  Patient Details  Name: Jennifer MyronJessica L Gamble MRN: [id number] Date of Birth: 11-07-1985 Referring Provider: Dr. Weston BrassKasouki Abu-Alnadi Noor   Encounter Date: 07/22/2017  PT End of Session - 07/22/17 1413    Visit Number  1    Date for PT Re-Evaluation  10/14/17    PT Start Time  1145    PT Stop Time  1230    PT Time Calculation (min)  45 min    Activity Tolerance  Patient tolerated treatment well    Behavior During Therapy  [facility] for tasks assessed/performed       Past Medical History:  Diagnosis Date  . Abnormal Pap smear    CIN II  . Acid reflux   . Depression   . Polycystic ovarian disease   . Ulcer     Past Surgical History:  Procedure Laterality Date  . GYNECOLOGIC CRYOSURGERY    . WISDOM TOOTH EXTRACTION      There were no vitals filed for this visit.   Subjective Assessment - 07/22/17 1151    Subjective  Patient reports several years ago had several yeast infections.  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  Internal exam had tenderness in the deep pelvic floor muscles. Buring of the skin with urination. pain with penetration.     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 Alinda DeemKasouki Abu-Alnadi Noor    Onset Date/Surgical Date  07/23/15    Prior Therapy  None      Precautions   Precautions  None      Restrictions   Weight Bearing Restrictions  No      Balance Screen   Has the patient fallen in the past 6 months  No    Has the patient had a decrease in activity level because of a fear of falling?   No    Is the patient reluctant to leave their home because of a fear of falling?   No      Home Public house managernvironment   Living Environment  Private residence      Prior Function   Level of Independence  Independent    Vocation  Full time employment    Civil engineer, contractingVocation Requirements  hospital manager    Leisure  3x at the gym, 20 min cardio, 30 min weights      Cognition   Overall Cognitive Status  Within Functional Limits for tasks assessed      Posture/Postural Control   Posture/Postural Control  Postural  limitations      ROM / Strength   AROM / PROM / Strength  AROM;PROM;Strength      AROM   Lumbar Extension  decreased by 25%    Lumbar - Right Side Bend  decreased by 25%      PROM   Right Hip External Rotation   40    Left Hip Flexion  105    Left Hip External Rotation   55      Strength   Right Hip Extension  4/5    Right Hip ABduction  4-/5    Left Hip Extension  4/5    Left Hip ABduction  3+/5      Right Hip   Right Hip Flexion  100      Palpation   SI assessment   left ilium is rotated posterior    Palpation comment  left piriformis, levator ani; left lumbar paraspinals      Transfers   Transfers  Not assessed             Objective measurements completed on examination: See above findings.    Pelvic Floor Special Questions - 07/22/17 0001    Prior Pregnancies  Yes    Number of Pregnancies  1    Number of Vaginal Deliveries  1    Currently Sexually Active  Yes    Marinoff Scale  discomfort that does not affect completion    Urinary Leakage  Yes    Activities that cause leaking  Coughing;Sneezing;Laughing    Skin  Integrity  Intact;Erthema dryness    External Palpation  allodynia from 3-6 o'clock    Pelvic Floor Internal Exam  Patient confirms identification and approves PT to assess perineal and muscle    Exam Type  Vaginal    Palpation  tenderness located on bil. obturator internist, coccygeus, piriformis     Strength  fair squeeze, definite lift    Tone  increased tone               PT Education - 07/22/17 1413    Education provided  Yes    Education Details  centralize pain for lumbar    Person(s) Educated  Patient    Methods  Explanation;Demonstration;Verbal cues;Handout    Comprehension  Returned demonstration;Verbalized understanding       PT Short Term Goals - 07/22/17 1422      PT SHORT TERM GOAL #1   Title  independent with initial HEP for flexibility exercises and diaphragmatic breathing    Time  4    Period  Weeks    Status  New    Target Date  08/19/17      PT SHORT TERM GOAL #2   Title  pain with initial penile pentration decreased >/= 25%    Time  4    Period  Weeks    Status  New    Target Date  08/19/17      PT SHORT TERM GOAL #3   Title  pain in pelvic floor after intercourse decreased >/= 25% due to ability to relax the pelvic floor    Time  4    Period  Weeks    Status  New    Target Date  08/19/17      PT SHORT TERM GOAL #4   Title  back pain with daily activites decreased >/= 25% and is centralized    Time  4    Period  Weeks    Status  New    Target Date  08/19/17        PT Long Term Goals - 07/22/17 1425      PT LONG TERM GOAL #1   Title  independent with HEP and understand how to progress herself    Time  12    Period  Weeks    Status  New    Target Date  10/14/17      PT LONG TERM GOAL #2   Title  pain with initial penile pentration and after  intercourse decreased >/= 60%    Time  12    Period  Weeks    Status  New    Target Date  10/14/17      PT LONG TERM GOAL #3   Title  back pain with daily activities decreased >/= 75%     Time  12    Period  Weeks    Status  New    Target Date  10/14/17      PT LONG TERM GOAL #4   Title  burning with urination decreased >/= 30% due to improve vaginal health    Time  12    Period  Weeks    Status  New    Target Date  10/14/17      PT LONG TERM GOAL #5   Title  understand how to perform perineal soft tissue work to decrease tone of pelvic floor muscles    Time  12    Period  Weeks    Status  New    Target Date  10/14/17             Plan - 07/22/17 1414    Clinical Impression Statement  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  Pelvic floor pain started several years ago after several yeast infections. Her average vaginal pain is 2/10 but can increase to a 7/10.  Vaginal pain is increased with initial penetration of the penis, urinating, contact with clothing and after intercourse. Back pain is radiating into the left hip at level  4/10 with movement.  Lumbar extension and right sidebending decreased by 25% wiht increased movement of L2on L3.  Patient has tightness in bilateral hip flexion and external rotation.  Patient has weaknes in bilateral hip abduction and extension.  Left ilium is rotated posteriorly. Palpable tenderness located in left piriformis externally and bilateral levator ani and left quadratus.  Internally tenderness located in bilateral obturator internist, coccygeus and piriformis. Pelvic floor strength is 3/5 with decreased strength of right urethra sphincter.  Patient lumbar pain is centralized with lateral shift in standing. Patient will benfit from skilled therapy to reduce pain, improve pelvic floor function and improve core strength.     History and Personal Factors relevant to plan of care:  None pertinent    Clinical Presentation  Stable    Clinical Presentation due to:  stable condition    Clinical Decision Making  Low    Rehab Potential  Excellent    Clinical Impairments Affecting Rehab Potential   None    PT Frequency  2x / week    PT Duration  12 weeks    PT Treatment/Interventions  Biofeedback;Cryotherapy;Electrical Stimulation;Iontophoresis 4mg /ml Dexamethasone;Moist Heat;Therapeutic exercise;Therapeutic activities;Neuromuscular re-education;Patient/family education;Passive range of motion;Manual techniques;Dry needling    PT Next Visit Plan  continue with McKenzie method for back pain, pelvic floor meditation, hip stretches, diaphragmatic breathing, soft tissue work, Dry needling to lumbar and  piriformis    PT Home Exercise Plan  hip stretches, diaphragmatic breathing    Consulted and Agree with Plan of Care  Patient       Patient will benefit from skilled therapeutic intervention in order to improve the following deficits and impairments:  Increased fascial restricitons, Pain, Decreased activity tolerance, Decreased range of motion, Decreased strength, Increased muscle spasms, Impaired tone  Visit Diagnosis: Other muscle spasm - Plan: PT plan of care cert/re-cert  Chronic left-sided low back pain with left-sided sciatica - Plan: PT plan of care cert/re-cert  Muscle weakness (generalized) - Plan: PT plan of care cert/re-cert     Problem List There are no active problems to display for this patient.   Eulis FosterCheryl Gray, PT 07/22/17 2:32 PM   [facility] [address], [address] [place], [place], [address] Phone: [phone]   Fax:  [phone]  Name: Jennifer Gamble MRN: [id number] Date of Birth: 1985/11/15

## 2017-07-22 NOTE — Patient Instructions (Addendum)
   Lean against a wall,with right side,  then push your right hips in toward the wall. Hold till pain is in center of spine.  Do every hour.    No bending at waist just at hips   Lubrication . Used for intercourse to reduce friction . Avoid ones that have glycerin, warming gels, tingling gels, icing or cooling gel, scented . Avoid parabens due to a preservative similar to female sex hormone . May need to be reapplied once or several times during sexual activity . Can be applied to both partners genitals prior to vaginal penetration to minimize friction or irritation . Prevent irritation and mucosal tears that cause post coital pain and increased the risk of vaginal and urinary tract infections . Oil-based lubricants cannot be used with condoms due to breaking them down.  Least likely to irritate vaginal tissue.  . Plant based-lubes are safe . Silicone-based lubrication are thicker and last long and used for post-menopausal women  Vaginal Lubricators Here is a list of some suggested lubricators you can use for intercourse. Use the most hypoallergenic product.  You can place on you or your partner.   Slippery Stuff  Sylk or Sliquid Natural H2O ( good  if frequent UTI's)  Blossom Organics (www.blossom-organics.com)  Luvena   Coconut oil  PJur Woman Nude- water based lubricant, amazon  Uberlube- Amazon  Aloe Vera  Yes lubricant- WellPointmazon  Wet Platinum-Silicone, Target, [facility]  Olive and Bee intimate cream-  www.oliveandbee.com.au Things to avoid in lubricants are glycerin, warming gels, tingling gels, icing or cooling  gels, and scented gels.  Also avoid Vaseline. KY jelly, Replens, and Astroglide kills good bacteria(lactobacilli)  Things to avoid in the vaginal area . Do not use things to irritate the vulvar area . No lotions- see below . No soaps; can use Aveeno or Calendula cleanser if needed. Must be gentle . No deodorants . No douches . Good to sleep without  underwear to let the vaginal area to air out . No scrubbing: spread the lips to let warm water rinse over labias and pat dry  Creams that can be used on the Vulva Area  V [employer]magic-amazon  Vital V Baxter InternationalWild Yam Salve  Julva- Jabil Circuitmazon  MoonMaid Botanical Pro-Meno Wild Yam Cream  Desert Havest Releveum or [facility] Harvest Gele    [facility] [address], [address] [place], [place] [address] Phone # [phone] Fax [phone]

## 2017-08-02 DIAGNOSIS — S39012D Strain of muscle, fascia and tendon of lower back, subsequent encounter: Secondary | ICD-10-CM | POA: Diagnosis not present

## 2017-08-05 ENCOUNTER — Ambulatory Visit: Payer: BLUE CROSS/BLUE SHIELD | Attending: Obstetrics & Gynecology | Admitting: Physical Therapy

## 2017-08-05 ENCOUNTER — Encounter: Payer: Self-pay | Admitting: Physical Therapy

## 2017-08-05 DIAGNOSIS — G8929 Other chronic pain: Secondary | ICD-10-CM | POA: Diagnosis not present

## 2017-08-05 DIAGNOSIS — M6281 Muscle weakness (generalized): Secondary | ICD-10-CM | POA: Insufficient documentation

## 2017-08-05 DIAGNOSIS — M5442 Lumbago with sciatica, left side: Secondary | ICD-10-CM | POA: Insufficient documentation

## 2017-08-05 DIAGNOSIS — M62838 Other muscle spasm: Secondary | ICD-10-CM | POA: Diagnosis not present

## 2017-08-05 NOTE — Patient Instructions (Addendum)
Guided Meditation for Pelvic Floor Relaxation  FemFusion Fitness   FemFusion Fitness   Toilet Meditation PhysioYoga   Shelly Prosko    Toileting Techniques for Bowel Movements (Defecation) Using your belly (abdomen) and pelvic floor muscles to have a bowel movement is usually instinctive.  Sometimes people can have problems with these muscles and have to relearn proper defecation (emptying) techniques.  If you have weakness in your muscles, organs that are falling out, decreased sensation in your pelvis, or ignore your urge to go, you may find yourself straining to have a bowel movement.  You are straining if you are: . holding your breath or taking in a huge gulp of air and holding it  . keeping your lips and jaw tensed and closed tightly . turning red in the face because of excessive pushing or forcing . developing or worsening your  hemorrhoids . getting faint while pushing . not emptying completely and have to defecate many times a day  If you are straining, you are actually making it harder for yourself to have a bowel movement.  Many people find they are pulling up with the pelvic floor muscles and closing off instead of opening the anus. Due to lack pelvic floor relaxation and coordination the abdominal muscles, one has to work harder to push the feces out.  Many people have never been taught how to defecate efficiently and effectively.  Notice what happens to your body when you are having a bowel movement.  While you are sitting on the toilet pay attention to the following areas: . Jaw and mouth position . Angle of your hips   . Whether your feet touch the ground or not . Arm placement  . Spine position . Waist . Belly tension . Anus (opening of the anal canal)  An Evacuation/Defecation Plan   Here are the 4 basic points:  1. Lean forward enough for your elbows to rest on your knees 2. Support your feet on the floor or use a low stool if your feet don't touch the floor   3. Push out your belly as if you have swallowed a beach ball-you should feel a widening of your waist 4. Open and relax your pelvic floor muscles, rather than tightening around the anus      The following conditions my require modifications to your toileting posture:  . If you have had surgery in the past that limits your back, hip, pelvic, knee or ankle flexibility . Constipation   Your healthcare practitioner may make the following additional suggestions and adjustments:  1) Sit on the toilet  a) Make sure your feet are supported. b) Notice your hip angle and spine position-most people find it effective to lean forward or raise their knees, which can help the muscles around the anus to relax  c) When you lean forward, place your forearms on your thighs for support  2) Relax suggestions a) Breath deeply in through your nose and out slowly through your mouth as if you are smelling the flowers and blowing out the candles. b) To become aware of how to relax your muscles, contracting and releasing muscles can be helpful.  Pull your pelvic floor muscles in tightly by using the image of holding back gas, or closing around the anus (visualize making a circle smaller) and lifting the anus up and in.  Then release the muscles and your anus should drop down and feel open. Repeat 5 times ending with the feeling of relaxation. c) Keep your  pelvic floor muscles relaxed; let your belly bulge out. d) The digestive tract starts at the mouth and ends at the anal opening, so be sure to relax both ends of the tube.  Place your tongue on the roof of your mouth with your teeth separated.  This helps relax your mouth and will help to relax the anus at the same time.  3) Empty (defecation) a) Keep your pelvic floor and sphincter relaxed, then bulge your anal muscles.  Make the anal opening wide.  b) Stick your belly out as if you have swallowed a beach ball. c) Make your belly wall hard using your belly  muscles while continuing to breathe. Doing this makes it easier to open your anus. d) Breath out and give a grunt (or try using other sounds such as ahhhh, shhhhh, ohhhh or grrrrrrr).  4) Finish a) As you finish your bowel movement, pull the pelvic floor muscles up and in.  This will leave your anus in the proper place rather than remaining pushed out and down. If you leave your anus pushed out and down, it will start to feel as though that is normal and give you incorrect signals about needing to have a bowel movement.    About Abdominal Massage  Abdominal massage, also called external colon massage, is a self-treatment circular massage technique that can reduce and eliminate gas and ease constipation. The colon naturally contracts in waves in a clockwise direction starting from inside the right hip, moving up toward the ribs, across the belly, and down inside the left hip.  When you perform circular abdominal massage, you help stimulate your colon's normal wave pattern of movement called peristalsis.  It is most beneficial when done after eating.  Positioning You can practice abdominal massage with oil while lying down, or in the shower with soap.  Some people find that it is just as effective to do the massage through clothing while sitting or standing.  How to Massage Start by placing your finger tips or knuckles on your right side, just inside your hip bone.  . Make small circular movements while you move upward toward your rib cage.   . Once you reach the bottom right side of your rib cage, take your circular movements across to the left side of the bottom of your rib cage.  . Next, move downward until you reach the inside of your left hip bone.  This is the path your feces travel in your colon. . Continue to perform your abdominal massage in this pattern for 10 minutes each day.     You can apply as much pressure as is comfortable in your massage.  Start gently and build pressure as you  continue to practice.  Notice any areas of pain as you massage; areas of slight pain may be relieved as you massage, but if you have areas of significant or intense pain, consult with your healthcare provider.  Other Considerations . General physical activity including bending and stretching can have a beneficial massage-like effect on the colon.  Deep breathing can also stimulate the colon because breathing deeply activates the same nervous system that supplies the colon.   . Abdominal massage should always be used in combination with a bowel-conscious diet that is high in the proper type of fiber for you, fluids (primarily water), and a regular exercise program.   [employer] You tube    [facility] [facility]ator [facility]age [employer]  by [facility] [address]  [place], [place] [address] Phone # [phone] Fax [phone]

## 2017-08-05 NOTE — Therapy (Addendum)
[facility] [address] [place], [place], [address] Phone: [phone]   Fax:  [phone]  Physical Therapy Treatment  Patient Details  Name: Jennifer Gamble MRN: [id number] Date of Birth: [birth year] Referring Provider: Dr. Christianne Dolin   Encounter Date: 08/05/2017  PT End of Session - 08/05/17 0848    Visit Number  2    Date for PT Re-Evaluation  10/14/17    PT Start Time  0800    PT Stop Time  0845    PT Time Calculation (min)  45 min    Activity Tolerance  Patient tolerated treatment well    Behavior During Therapy  [facility] for tasks assessed/performed       Past Medical History:  Diagnosis Date  . Abnormal Pap smear    CIN II  . Acid reflux   . Depression   . Polycystic ovarian disease   . Ulcer     Past Surgical History:  Procedure Laterality Date  . GYNECOLOGIC CRYOSURGERY    . WISDOM TOOTH EXTRACTION      There were no vitals filed for this visit.  Subjective Assessment - 08/05/17 0806    Subjective  The back exercises have helped.  I did not have to take any muscle relaxers.  I went to the doctor for my back and he refilled the my muscle relaxer presciption. I am very crampy and achy in the lower abdominal area. I am using the v-magic.     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  Yes    Pain Score  4     Pain Location  Vagina    Pain Orientation  Mid    Pain Descriptors / Indicators  Sore    Pain Type  Chronic pain    Pain Onset  More than a month ago    Pain Frequency  Intermittent    Aggravating Factors   intercourse and after intercourse; wiping to go to the bathroom    Pain Relieving Factors  no intercourse    Multiple Pain Sites  No                      [facility] Adult PT Treatment/Exercise - 08/05/17 0001      Self-Care   Self-Care  Other Self-Care Comments    Other Self-Care  Comments   you tube videos for yoga, meditation      Therapeutic Activites    Therapeutic Activities  ADL's    ADL's  sitting posture; toileting posture      Manual Therapy   Manual Therapy  Soft tissue mobilization;Myofascial release    Soft tissue mobilization  circular massage to abdoment to assist in bowel movement; bil. diaphgram and lower abdomen    Myofascial Release  release of fascia in respiratory diaphgram and urogenital diaphgram; release through the bladder to the uterus to the colon             PT Education - 08/05/17 0847    Education provided  Yes    Education Details  abdominal massage; toileting posture and meditation, you tube videos for yoga and mediatation; vaginal massager    Person(s) Educated  Patient    Methods  Explanation;Demonstration;Verbal cues;Handout    Comprehension  Returned demonstration;Verbalized understanding       PT Short Term Goals - 08/05/17 1594  PT SHORT TERM GOAL #1   Title  independent with initial HEP for flexibility exercises and diaphragmatic breathing    Time  4    Period  Weeks    Status  On-going      PT SHORT TERM GOAL #2   Title  pain with initial penile pentration decreased >/= 25%    Period  Weeks    Status  On-going      PT SHORT TERM GOAL #3   Title  pain in pelvic floor after intercourse decreased >/= 25% due to ability to relax the pelvic floor    Time  4    Period  Weeks    Status  On-going      PT SHORT TERM GOAL #4   Title  back pain with daily activites decreased >/= 25% and is centralized    Time  4    Period  Weeks    Status  Achieved        PT Long Term Goals - 07/22/17 1425      PT LONG TERM GOAL #1   Title  independent with HEP and understand how to progress herself    Time  12    Period  Weeks    Status  New    Target Date  10/14/17      PT LONG TERM GOAL #2   Title  pain with initial penile pentration and after  intercourse decreased >/= 60%    Time  12    Period  Weeks     Status  New    Target Date  10/14/17      PT LONG TERM GOAL #3   Title  back pain with daily activities decreased >/= 75%    Time  12    Period  Weeks    Status  New    Target Date  10/14/17      PT LONG TERM GOAL #4   Title  burning with urination decreased >/= 30% due to improve vaginal health    Time  12    Period  Weeks    Status  New    Target Date  10/14/17      PT LONG TERM GOAL #5   Title  understand how to perform perineal soft tissue work to decrease tone of pelvic floor muscles    Time  12    Period  Weeks    Status  New    Target Date  10/14/17            Plan - 08/05/17 0849    Clinical Impression Statement  Patient lower abdominal pain decreased after soft tissue work.  Patient is using the v-magic cream and Estrodial that is helping with the vaginal pain.  Patient is not painful just sore.  Patient had tightness in left diaphgram and left lower abdominal area.   Patient is aware of how to correct her posture and how it affects her pelvic floor.  Patient ill benefit from skilled therapy to reduce pain, improve pelvic floor function and improve core strength.     Rehab Potential  Excellent    Clinical Impairments Affecting Rehab Potential  None    PT Frequency  2x / week    PT Duration  12 weeks    PT Treatment/Interventions  Biofeedback;Cryotherapy;Electrical Stimulation;Iontophoresis 30m/ml Dexamethasone;Moist Heat;Therapeutic exercise;Therapeutic activities;Neuromuscular re-education;Patient/family education;Passive range of motion;Manual techniques;Dry needling    PT Next Visit Plan  lumbar extension; hip stretches; dry needling to  lumbar and piriformis; internal pelvic floor work    PT Home Exercise Plan  hip stretches, diaphragmatic breathing    Consulted and Agree with Plan of Care  Patient       Patient will benefit from skilled therapeutic intervention in order to improve the following deficits and impairments:  Increased fascial restricitons, Pain,  Decreased activity tolerance, Decreased range of motion, Decreased strength, Increased muscle spasms, Impaired tone  Visit Diagnosis: Other muscle spasm  Chronic left-sided low back pain with left-sided sciatica  Muscle weakness (generalized)     Problem List There are no active problems to display for this patient.   Earlie Counts, PT 08/05/17 8:53 AM    [facility] [address] [place], [place], [address] Phone: [phone]   Fax:  [phone]  Name: ARIAHNA SMIDDY MRN: [id number] Date of Birth: 1985-11-04 PHYSICAL THERAPY DISCHARGE SUMMARY  Visits from Start of Care: 2  Current functional level related to goals / functional outcomes: See above. Unable to assess patient due to not returning to therapy.    Remaining deficits: See above.    Education / Equipment: HEP Plan:                                                    Patient goals were not met. Patient is being discharged due to not returning since the last visit.  Thank you for the referral. Earlie Counts, PT 10/06/17 2:02 PM  ?????

## 2017-08-09 ENCOUNTER — Encounter: Payer: BLUE CROSS/BLUE SHIELD | Admitting: Physical Therapy

## 2017-08-11 ENCOUNTER — Encounter: Payer: BLUE CROSS/BLUE SHIELD | Admitting: Physical Therapy

## 2017-08-16 ENCOUNTER — Encounter: Payer: Self-pay | Admitting: Physical Therapy

## 2017-08-17 ENCOUNTER — Encounter: Payer: BLUE CROSS/BLUE SHIELD | Admitting: Physical Therapy

## 2017-08-19 ENCOUNTER — Encounter: Payer: BLUE CROSS/BLUE SHIELD | Admitting: Physical Therapy

## 2017-08-25 ENCOUNTER — Encounter: Payer: BLUE CROSS/BLUE SHIELD | Admitting: Physical Therapy

## 2017-08-30 ENCOUNTER — Encounter: Payer: BLUE CROSS/BLUE SHIELD | Admitting: Physical Therapy

## 2017-09-01 ENCOUNTER — Encounter: Payer: BLUE CROSS/BLUE SHIELD | Admitting: Physical Therapy

## 2017-11-17 DIAGNOSIS — L700 Acne vulgaris: Secondary | ICD-10-CM | POA: Diagnosis not present

## 2017-11-17 DIAGNOSIS — D225 Melanocytic nevi of trunk: Secondary | ICD-10-CM | POA: Diagnosis not present

## 2017-11-17 DIAGNOSIS — D1801 Hemangioma of skin and subcutaneous tissue: Secondary | ICD-10-CM | POA: Diagnosis not present

## 2017-11-17 DIAGNOSIS — L821 Other seborrheic keratosis: Secondary | ICD-10-CM | POA: Diagnosis not present

## 2018-01-04 DIAGNOSIS — Z13220 Encounter for screening for lipoid disorders: Secondary | ICD-10-CM | POA: Diagnosis not present

## 2018-01-04 DIAGNOSIS — Z0000 Encounter for general adult medical examination without abnormal findings: Secondary | ICD-10-CM | POA: Diagnosis not present

## 2018-01-04 DIAGNOSIS — Z131 Encounter for screening for diabetes mellitus: Secondary | ICD-10-CM | POA: Diagnosis not present

## 2018-01-04 DIAGNOSIS — Z136 Encounter for screening for cardiovascular disorders: Secondary | ICD-10-CM | POA: Diagnosis not present

## 2018-02-08 DIAGNOSIS — R3915 Urgency of urination: Secondary | ICD-10-CM | POA: Diagnosis not present

## 2018-05-24 DIAGNOSIS — N94819 Vulvodynia, unspecified: Secondary | ICD-10-CM | POA: Diagnosis not present

## 2018-05-24 DIAGNOSIS — G8929 Other chronic pain: Secondary | ICD-10-CM | POA: Diagnosis not present

## 2018-05-24 DIAGNOSIS — N9489 Other specified conditions associated with female genital organs and menstrual cycle: Secondary | ICD-10-CM | POA: Diagnosis not present

## 2018-05-24 DIAGNOSIS — N9419 Other specified dyspareunia: Secondary | ICD-10-CM | POA: Diagnosis not present

## 2018-05-27 DIAGNOSIS — R300 Dysuria: Secondary | ICD-10-CM | POA: Diagnosis not present

## 2018-07-21 DIAGNOSIS — N9419 Other specified dyspareunia: Secondary | ICD-10-CM | POA: Diagnosis not present

## 2018-07-21 DIAGNOSIS — R102 Pelvic and perineal pain: Secondary | ICD-10-CM | POA: Diagnosis not present

## 2018-07-21 DIAGNOSIS — N94819 Vulvodynia, unspecified: Secondary | ICD-10-CM | POA: Diagnosis not present

## 2018-07-21 DIAGNOSIS — G8929 Other chronic pain: Secondary | ICD-10-CM | POA: Diagnosis not present

## 2018-09-28 DIAGNOSIS — R42 Dizziness and giddiness: Secondary | ICD-10-CM | POA: Diagnosis not present

## 2018-09-28 DIAGNOSIS — H6122 Impacted cerumen, left ear: Secondary | ICD-10-CM | POA: Diagnosis not present

## 2018-09-28 DIAGNOSIS — H9209 Otalgia, unspecified ear: Secondary | ICD-10-CM | POA: Diagnosis not present

## 2018-12-27 DIAGNOSIS — J3503 Chronic tonsillitis and adenoiditis: Secondary | ICD-10-CM | POA: Diagnosis not present

## 2018-12-27 DIAGNOSIS — J353 Hypertrophy of tonsils with hypertrophy of adenoids: Secondary | ICD-10-CM | POA: Diagnosis not present

## 2019-02-08 DIAGNOSIS — Z8249 Family history of ischemic heart disease and other diseases of the circulatory system: Secondary | ICD-10-CM | POA: Diagnosis not present

## 2019-02-08 DIAGNOSIS — Z0000 Encounter for general adult medical examination without abnormal findings: Secondary | ICD-10-CM | POA: Diagnosis not present

## 2019-02-08 DIAGNOSIS — Z23 Encounter for immunization: Secondary | ICD-10-CM | POA: Diagnosis not present

## 2019-02-08 DIAGNOSIS — J029 Acute pharyngitis, unspecified: Secondary | ICD-10-CM | POA: Diagnosis not present

## 2019-02-08 DIAGNOSIS — Z131 Encounter for screening for diabetes mellitus: Secondary | ICD-10-CM | POA: Diagnosis not present

## 2019-02-10 ENCOUNTER — Other Ambulatory Visit: Payer: Self-pay | Admitting: Otolaryngology

## 2019-02-24 ENCOUNTER — Encounter (HOSPITAL_BASED_OUTPATIENT_CLINIC_OR_DEPARTMENT_OTHER): Payer: Self-pay | Admitting: *Deleted

## 2019-02-27 ENCOUNTER — Other Ambulatory Visit: Payer: Self-pay

## 2019-02-27 ENCOUNTER — Encounter (HOSPITAL_BASED_OUTPATIENT_CLINIC_OR_DEPARTMENT_OTHER): Payer: Self-pay | Admitting: *Deleted

## 2019-02-28 ENCOUNTER — Other Ambulatory Visit (HOSPITAL_COMMUNITY)
Admission: RE | Admit: 2019-02-28 | Discharge: 2019-02-28 | Disposition: A | Discharge status: Home / Self Care | Payer: BC Managed Care – PPO | Source: Ambulatory Visit | Attending: Otolaryngology | Admitting: Otolaryngology

## 2019-02-28 DIAGNOSIS — Z20828 Contact with and (suspected) exposure to other viral communicable diseases: Secondary | ICD-10-CM | POA: Insufficient documentation

## 2019-02-28 LAB — SARS CORONAVIRUS 2 (TAT 6-24 HRS): SARS Coronavirus 2: NEGATIVE

## 2019-03-03 ENCOUNTER — Encounter (HOSPITAL_BASED_OUTPATIENT_CLINIC_OR_DEPARTMENT_OTHER)
Admission: RE | Disposition: A | Discharge status: Home / Self Care | Payer: Self-pay | Source: Home / Self Care | Attending: Otolaryngology

## 2019-03-03 ENCOUNTER — Encounter (HOSPITAL_BASED_OUTPATIENT_CLINIC_OR_DEPARTMENT_OTHER): Payer: Self-pay | Admitting: *Deleted

## 2019-03-03 ENCOUNTER — Ambulatory Visit (HOSPITAL_BASED_OUTPATIENT_CLINIC_OR_DEPARTMENT_OTHER)
Admission: RE | Admit: 2019-03-03 | Discharge: 2019-03-03 | Disposition: A | Discharge status: Home / Self Care | Payer: BC Managed Care – PPO | Attending: Otolaryngology | Admitting: Otolaryngology

## 2019-03-03 ENCOUNTER — Ambulatory Visit (HOSPITAL_BASED_OUTPATIENT_CLINIC_OR_DEPARTMENT_OTHER): Payer: BC Managed Care – PPO | Admitting: Certified Registered"

## 2019-03-03 ENCOUNTER — Other Ambulatory Visit: Payer: Self-pay

## 2019-03-03 DIAGNOSIS — J3501 Chronic tonsillitis: Secondary | ICD-10-CM | POA: Insufficient documentation

## 2019-03-03 DIAGNOSIS — J353 Hypertrophy of tonsils with hypertrophy of adenoids: Secondary | ICD-10-CM | POA: Diagnosis not present

## 2019-03-03 DIAGNOSIS — J029 Acute pharyngitis, unspecified: Secondary | ICD-10-CM | POA: Insufficient documentation

## 2019-03-03 DIAGNOSIS — J3503 Chronic tonsillitis and adenoiditis: Secondary | ICD-10-CM | POA: Diagnosis not present

## 2019-03-03 DIAGNOSIS — F329 Major depressive disorder, single episode, unspecified: Secondary | ICD-10-CM | POA: Diagnosis not present

## 2019-03-03 DIAGNOSIS — E282 Polycystic ovarian syndrome: Secondary | ICD-10-CM | POA: Diagnosis not present

## 2019-03-03 DIAGNOSIS — K219 Gastro-esophageal reflux disease without esophagitis: Secondary | ICD-10-CM | POA: Diagnosis not present

## 2019-03-03 HISTORY — PX: TONSILLECTOMY AND ADENOIDECTOMY: SHX28

## 2019-03-03 SURGERY — TONSILLECTOMY AND ADENOIDECTOMY
Anesthesia: General | Site: Mouth | Laterality: Bilateral

## 2019-03-03 MED ORDER — DEXAMETHASONE SODIUM PHOSPHATE 4 MG/ML IJ SOLN
INTRAMUSCULAR | Status: DC | PRN
  Administered 2019-03-03: 10 mg via INTRAVENOUS

## 2019-03-03 MED ORDER — OXYCODONE HCL 5 MG/5ML PO SOLN: 5.0000 mg | ORAL | 0 refills | Status: AC | PRN

## 2019-03-03 MED ORDER — AMOXICILLIN 400 MG/5ML PO SUSR: 800.0000 mg | Freq: Two times a day (BID) | ORAL | 0 refills | Status: AC

## 2019-03-03 MED ORDER — LIDOCAINE HCL (CARDIAC) PF 100 MG/5ML IV SOSY
PREFILLED_SYRINGE | INTRAVENOUS | Status: DC | PRN
  Administered 2019-03-03: 100 mg via INTRAVENOUS

## 2019-03-03 MED ORDER — FENTANYL CITRATE (PF) 100 MCG/2ML IJ SOLN
INTRAMUSCULAR | Status: AC
  Filled 2019-03-03: qty 2

## 2019-03-03 MED ORDER — OXYCODONE HCL 5 MG/5ML PO SOLN
5.0000 mg | Freq: Once | ORAL | Status: AC
  Administered 2019-03-03: 5 mg via ORAL

## 2019-03-03 MED ORDER — MIDAZOLAM HCL 2 MG/2ML IJ SOLN
INTRAMUSCULAR | Status: AC
  Filled 2019-03-03: qty 2

## 2019-03-03 MED ORDER — SUGAMMADEX SODIUM 200 MG/2ML IV SOLN
INTRAVENOUS | Status: DC | PRN
  Administered 2019-03-03: 140 mg via INTRAVENOUS

## 2019-03-03 MED ORDER — OXYCODONE HCL 5 MG/5ML PO SOLN
ORAL | Status: AC
  Filled 2019-03-03: qty 5

## 2019-03-03 MED ORDER — ROCURONIUM BROMIDE 100 MG/10ML IV SOLN
INTRAVENOUS | Status: DC | PRN
  Administered 2019-03-03: 70 mg via INTRAVENOUS

## 2019-03-03 MED ORDER — SUCCINYLCHOLINE CHLORIDE 200 MG/10ML IV SOSY
PREFILLED_SYRINGE | INTRAVENOUS | Status: AC
  Filled 2019-03-03: qty 10

## 2019-03-03 MED ORDER — DEXAMETHASONE SODIUM PHOSPHATE 10 MG/ML IJ SOLN
INTRAMUSCULAR | Status: AC
  Filled 2019-03-03: qty 1

## 2019-03-03 MED ORDER — ROCURONIUM BROMIDE 10 MG/ML (PF) SYRINGE
PREFILLED_SYRINGE | INTRAVENOUS | Status: AC
  Filled 2019-03-03: qty 10

## 2019-03-03 MED ORDER — FENTANYL CITRATE (PF) 100 MCG/2ML IJ SOLN
50.0000 ug | INTRAMUSCULAR | Status: AC | PRN
  Administered 2019-03-03 (×2): 50 ug via INTRAVENOUS
  Administered 2019-03-03: 100 ug via INTRAVENOUS

## 2019-03-03 MED ORDER — PROPOFOL 10 MG/ML IV BOLUS
INTRAVENOUS | Status: AC
  Filled 2019-03-03: qty 40

## 2019-03-03 MED ORDER — SCOPOLAMINE 1 MG/3DAYS TD PT72: 1.0000 | MEDICATED_PATCH | Freq: Once | TRANSDERMAL | Status: DC

## 2019-03-03 MED ORDER — ONDANSETRON HCL 4 MG/2ML IJ SOLN: 4.0000 mg | Freq: Once | INTRAMUSCULAR | Status: DC | PRN

## 2019-03-03 MED ORDER — ONDANSETRON HCL 4 MG/2ML IJ SOLN
INTRAMUSCULAR | Status: DC | PRN
  Administered 2019-03-03: 4 mg via INTRAVENOUS

## 2019-03-03 MED ORDER — SODIUM CHLORIDE 0.9 % IR SOLN
Status: DC | PRN
  Administered 2019-03-03: 1

## 2019-03-03 MED ORDER — ONDANSETRON HCL 4 MG/2ML IJ SOLN
INTRAMUSCULAR | Status: AC
  Filled 2019-03-03: qty 2

## 2019-03-03 MED ORDER — HYDROMORPHONE HCL 1 MG/ML IJ SOLN: 0.2500 mg | INTRAMUSCULAR | Status: DC | PRN

## 2019-03-03 MED ORDER — MIDAZOLAM HCL 2 MG/2ML IJ SOLN
1.0000 mg | INTRAMUSCULAR | Status: DC | PRN
  Administered 2019-03-03: 2 mg via INTRAVENOUS

## 2019-03-03 MED ORDER — PROPOFOL 10 MG/ML IV BOLUS
INTRAVENOUS | Status: DC | PRN
  Administered 2019-03-03: 120 mg via INTRAVENOUS

## 2019-03-03 MED ORDER — OXYMETAZOLINE HCL 0.05 % NA SOLN
NASAL | Status: DC | PRN
  Administered 2019-03-03: 1 via TOPICAL

## 2019-03-03 MED ORDER — OXYCODONE HCL 5 MG PO TABS: 5.0000 mg | ORAL_TABLET | Freq: Once | ORAL | Status: DC

## 2019-03-03 MED ORDER — LACTATED RINGERS IV SOLN
INTRAVENOUS | Status: DC
  Administered 2019-03-03: 07:00:00 via INTRAVENOUS
  Administered 2019-03-03: 10 mL/h via INTRAVENOUS

## 2019-03-03 MED ORDER — MEPERIDINE HCL 25 MG/ML IJ SOLN: 6.2500 mg | INTRAMUSCULAR | Status: DC | PRN

## 2019-03-03 MED ORDER — ATROPINE SULFATE 0.4 MG/ML IJ SOLN
INTRAMUSCULAR | Status: AC
  Filled 2019-03-03: qty 1

## 2019-03-03 SURGICAL SUPPLY — 29 items
BNDG COHESIVE 2X5 TAN STRL LF (GAUZE/BANDAGES/DRESSINGS) IMPLANT
CANISTER SUCT 1200ML W/VALVE (MISCELLANEOUS) ×2 IMPLANT
CATH ROBINSON RED A/P 10FR (CATHETERS) IMPLANT
CATH ROBINSON RED A/P 14FR (CATHETERS) ×2 IMPLANT
COAGULATOR SUCT SWTCH 10FR 6 (ELECTROSURGICAL) IMPLANT
COVER BACK TABLE REUSABLE LG (DRAPES) ×2 IMPLANT
COVER MAYO STAND REUSABLE (DRAPES) ×2 IMPLANT
COVER WAND RF STERILE (DRAPES) IMPLANT
DRAPE HALF SHEET 70X43 (DRAPES) ×2 IMPLANT
ELECT REM PT RETURN 9FT ADLT (ELECTROSURGICAL) ×2
ELECT REM PT RETURN 9FT PED (ELECTROSURGICAL)
ELECTRODE REM PT RETRN 9FT PED (ELECTROSURGICAL) IMPLANT
ELECTRODE REM PT RTRN 9FT ADLT (ELECTROSURGICAL) ×1 IMPLANT
GAUZE SPONGE 4X4 12PLY STRL LF (GAUZE/BANDAGES/DRESSINGS) ×2 IMPLANT
GLOVE BIO SURGEON STRL SZ7.5 (GLOVE) ×2 IMPLANT
GOWN STRL REUS W/ TWL LRG LVL3 (GOWN DISPOSABLE) ×2 IMPLANT
GOWN STRL REUS W/TWL LRG LVL3 (GOWN DISPOSABLE) ×2
IV NS 500ML (IV SOLUTION) ×1
IV NS 500ML BAXH (IV SOLUTION) ×1 IMPLANT
MARKER SKIN DUAL TIP RULER LAB (MISCELLANEOUS) IMPLANT
NS IRRIG 1000ML POUR BTL (IV SOLUTION) ×2 IMPLANT
SOLUTION BUTLER CLEAR DIP (MISCELLANEOUS) ×2 IMPLANT
SPONGE TONSIL TAPE 1.25 RFD (DISPOSABLE) ×2 IMPLANT
SYR BULB 3OZ (MISCELLANEOUS) IMPLANT
TOWEL GREEN STERILE FF (TOWEL DISPOSABLE) ×2 IMPLANT
TUBE CONNECTING 20X1/4 (TUBING) ×2 IMPLANT
TUBE SALEM SUMP 12R W/ARV (TUBING) IMPLANT
TUBE SALEM SUMP 16 FR W/ARV (TUBING) ×2 IMPLANT
WAND COBLATOR 70 EVAC XTRA (SURGICAL WAND) ×2 IMPLANT

## 2019-03-03 NOTE — H&P (Signed)
Cc: Chronic tonsillitis/pharyngitis  HPI: The patient is a 33 year old female who returns today complaining of persistent chronic tonsillitis and tonsillar hypertrophy.  The patient was last seen in 2018.  At that time, she was noted to have chronically inflamed tonsils with frequent tonsillitis and halitosis.  The treatment options were discussed at that time. The patient decided at that time to proceed with conservative observation. The patient returns today complaining of persistent symptoms.  She continues to have weekly sore throat episodes.  She has also noted chronic throat irritation from her tonsil stones.  She has chronic halitosis.  She is interested in surgical intervention.  Currently she denies any fever, dysphagia, or odynophagia. No other ENT, GI, or respiratory issue noted since the last visit.   Exam General: Communicates without difficulty, well nourished, no acute distress. Head: Normocephalic, no evidence injury, no tenderness, facial buttresses intact without stepoff. Eyes: PERRL, EOMI. No scleral icterus, conjunctivae clear. Neuro: CN II exam reveals vision grossly intact.  No nystagmus at any point of gaze. Ears: Auricles well formed without lesions.  Ear canals are intact without mass or lesion.  No erythema or edema is appreciated.  The TMs are intact without fluid. Nose: External evaluation reveals normal support and skin without lesions.  Dorsum is intact.  Anterior rhinoscopy reveals healthy pink mucosa over anterior aspect of inferior turbinates and intact septum.  No purulence noted. Oral:  Oral cavity and oropharynx are intact, symmetric, without erythema or edema.  Mucosa is moist without lesions. Tonsils 2+, cryptic, and mildly erythematous. Neck: Full range of motion without pain.  There is no significant lymphadenopathy.  No masses palpable.  Thyroid bed within normal limits to palpation.  Parotid glands and submandibular glands equal bilaterally without mass.  Trachea is  midline. Neuro:  CN 2-12 grossly intact. Gait normal. Vestibular: No nystagmus at any point of gaze. The cerebellar examination is unremarkable.   Assessment 1.  The patient's history and physical exam findings are consistent with chronic tonsillitis/pharyngitis, secondary to her adenotonsillar hypertrophy.   2  No acute infection is noted today.   Plan  1.  The treatment options are extensively discussed with the patient.  The options include continuing conservative observation with medical therapy versus adenotonsillectomy.  The risks, benefits, alternatives and details of the surgical procedure are extensively reviewed with the patient.  Questions are invited and answered.  2.  The patient would like to proceed with the adenotonsillectomy procedure.  We will schedule the procedure in accordance to her schedule.

## 2019-03-03 NOTE — Discharge Instructions (Addendum)
[facility] M[facility] Postoperative Instructions for Tonsillectomy & Adenoidectomy (T&A) Activity Restrict activity at home for the first two days, resting as much as possible. Light indoor activity is best. You may usually return to school or work within a week but void strenuous activity and sports for two weeks. Sleep with your head elevated on 2-3 pillows for 3-4 days to help decrease swelling. Diet Due to tissue swelling and throat discomfort, you may have little desire to drink for several days. However fluids are very important to prevent dehydration. You will find that non-acidic juices, soups, popsicles, Jell-O, custard, puddings, and any soft or mashed foods taken in small quantities can be swallowed fairly easily. Try to increase your fluid and food intake as the discomfort subsides. It is recommended that a child receive 1-1/2 quarts of fluid in a 24-hour period. Adult require twice this amount.  Discomfort Your sore throat may be relieved by applying an ice collar to your neck and/or by taking Tylenol. You may experience an earache, which is due to referred pain from the throat. Referred ear pain is commonly felt at night when trying to rest.   Post Anesthesia Home Care Instructions  Activity: Get plenty of rest for the remainder of the day. A responsible individual must stay with you for 24 hours following the procedure.  For the next 24 hours, DO NOT: -Drive a car -Paediatric nurse -Drink alcoholic beverages -Take any medication unless instructed by your physician -Make any legal decisions or sign important papers.  Meals: Start with liquid foods such as gelatin or soup. Progress to regular foods as tolerated. Avoid greasy, spicy, heavy foods. If nausea and/or vomiting occur, drink only clear liquids until the nausea and/or vomiting subsides. Call your physician if vomiting continues.  Special Instructions/Symptoms: Your throat may feel dry or sore from the anesthesia or  the breathing tube placed in your throat during surgery. If this causes discomfort, gargle with warm salt water. The discomfort should disappear within 24 hours.  If you had a scopolamine patch placed behind your ear for the management of post- operative nausea and/or vomiting:  1. The medication in the patch is effective for 72 hours, after which it should be removed.  Wrap patch in a tissue and discard in the trash. Wash hands thoroughly with soap and water. 2. You may remove the patch earlier than 72 hours if you experience unpleasant side effects which may include dry mouth, dizziness or visual disturbances. 3. Avoid touching the patch. Wash your hands with soap and water after contact with the patch.      Bleeding                        Although rare, there is risk of having some bleeding during the first 2 weeks after having a T&A. This usually happens between days 7-10 postoperatively. If you or your child should have any bleeding, try to remain calm. We recommend sitting up quietly in a chair and gently spitting out the blood into a bowl. For adults, gargling gently with ice water may help. If the bleeding does not stop after a short time (5 minutes), is more than 1 teaspoonful, or if you become worried, please call our office at [phone] or go directly to the nearest hospital emergency room. Do not eat or drink anything prior to going to the hospital as you may need to be taken to the operating room in order to control  the bleeding. GENERAL CONSIDERATIONS 1. Brush your teeth regularly. Avoid mouthwashes and gargles for three weeks. You may gargle gently with warm salt-water as necessary or spray with Chloraseptic. You may make salt-water by placing 2 teaspoons of table salt into a quart of fresh water. Warm the salt-water in a microwave to a luke warm temperature.  2. Avoid exposure to colds and upper respiratory infections if possible.  3. If you look into a mirror or into your child's  mouth, you will see white-gray patches in the back of the throat. This is normal after having a T&A and is like a scab that forms on the skin after an abrasion. It will disappear once the back of the throat heals completely. However, it may cause a noticeable odor; this too will disappear with time. Again, warm salt-water gargles may be used to help keep the throat clean and promote healing.  4. You may notice a temporary change in voice quality, such as a higher pitched voice or a nasal sound, until healing is complete. This may last for 1-2 weeks and should resolve.  5. Do not take or give you child any medications that we have not prescribed or recommended.  6. Snoring may occur, especially at night, for the first week after a T&A. It is due to swelling of the soft palate and will usually resolve.  Please call our office at [phone] if you have any questions.    Post Anesthesia Home Care Instructions  Activity: Get plenty of rest for the remainder of the day. A responsible individual must stay with you for 24 hours following the procedure.  For the next 24 hours, DO NOT: -Drive a car -Advertising copywriterperate machinery -Drink alcoholic beverages -Take any medication unless instructed by your physician -Make any legal decisions or sign important papers.  Meals: Start with liquid foods such as gelatin or soup. Progress to regular foods as tolerated. Avoid greasy, spicy, heavy foods. If nausea and/or vomiting occur, drink only clear liquids until the nausea and/or vomiting subsides. Call your physician if vomiting continues.  Special Instructions/Symptoms: Your throat may feel dry or sore from the anesthesia or the breathing tube placed in your throat during surgery. If this causes discomfort, gargle with warm salt water. The discomfort should disappear within 24 hours.  If you had a scopolamine patch placed behind your ear for the management of post- operative nausea and/or vomiting:  1. The medication  in the patch is effective for 72 hours, after which it should be removed.  Wrap patch in a tissue and discard in the trash. Wash hands thoroughly with soap and water. 2. You may remove the patch earlier than 72 hours if you experience unpleasant side effects which may include dry mouth, dizziness or visual disturbances. 3. Avoid touching the patch. Wash your hands with soap and water after contact with the patch.

## 2019-03-03 NOTE — H&P (View-Only) (Signed)
Cc: Chronic tonsillitis/pharyngitis  HPI: The patient is a 33-year-old female who returns today complaining of persistent chronic tonsillitis and tonsillar hypertrophy.  The patient was last seen in 2018.  At that time, she was noted to have chronically inflamed tonsils with frequent tonsillitis and halitosis.  The treatment options were discussed at that time. The patient decided at that time to proceed with conservative observation. The patient returns today complaining of persistent symptoms.  She continues to have weekly sore throat episodes.  She has also noted chronic throat irritation from her tonsil stones.  She has chronic halitosis.  She is interested in surgical intervention.  Currently she denies any fever, dysphagia, or odynophagia. No other ENT, GI, or respiratory issue noted since the last visit.   Exam General: Communicates without difficulty, well nourished, no acute distress. Head: Normocephalic, no evidence injury, no tenderness, facial buttresses intact without stepoff. Eyes: PERRL, EOMI. No scleral icterus, conjunctivae clear. Neuro: CN II exam reveals vision grossly intact.  No nystagmus at any point of gaze. Ears: Auricles well formed without lesions.  Ear canals are intact without mass or lesion.  No erythema or edema is appreciated.  The TMs are intact without fluid. Nose: External evaluation reveals normal support and skin without lesions.  Dorsum is intact.  Anterior rhinoscopy reveals healthy pink mucosa over anterior aspect of inferior turbinates and intact septum.  No purulence noted. Oral:  Oral cavity and oropharynx are intact, symmetric, without erythema or edema.  Mucosa is moist without lesions. Tonsils 2+, cryptic, and mildly erythematous. Neck: Full range of motion without pain.  There is no significant lymphadenopathy.  No masses palpable.  Thyroid bed within normal limits to palpation.  Parotid glands and submandibular glands equal bilaterally without mass.  Trachea is  midline. Neuro:  CN 2-12 grossly intact. Gait normal. Vestibular: No nystagmus at any point of gaze. The cerebellar examination is unremarkable.   Assessment 1.  The patient's history and physical exam findings are consistent with chronic tonsillitis/pharyngitis, secondary to her adenotonsillar hypertrophy.   2  No acute infection is noted today.   Plan  1.  The treatment options are extensively discussed with the patient.  The options include continuing conservative observation with medical therapy versus adenotonsillectomy.  The risks, benefits, alternatives and details of the surgical procedure are extensively reviewed with the patient.  Questions are invited and answered.  2.  The patient would like to proceed with the adenotonsillectomy procedure.  We will schedule the procedure in accordance to her schedule.  

## 2019-03-03 NOTE — Op Note (Signed)
DATE OF PROCEDURE:  03/03/2019                              OPERATIVE REPORT  SURGEON:  Leta Baptist, MD  PREOPERATIVE DIAGNOSES: 1. Adenotonsillar hypertrophy. 2. Chronic tonsillitis and pharyngitis  POSTOPERATIVE DIAGNOSES: 1. Adenotonsillar hypertrophy. 2. Chronic tonsillitis and pharyngitis  PROCEDURE PERFORMED:  Adenotonsillectomy.  ANESTHESIA:  General endotracheal tube anesthesia.  COMPLICATIONS:  None.  ESTIMATED BLOOD LOSS:  Minimal.  INDICATION FOR PROCEDURE:  Jennifer Gamble is a 33 y.o. female with a history of chronic tonsillitis/pharyngitis and halitosis.  According to the patient, she has been experiencing chronic throat discomfort with halitosis for several years. The patient continues to be symptomatic despite medical treatments. On examination, the patient was noted to have bilateral cryptic tonsils, with numerous tonsilloliths. Based on the above findings, the decision was made for the patient to undergo the adenotonsillectomy procedure. Likelihood of success in reducing symptoms was also discussed.  The risks, benefits, alternatives, and details of the procedure were discussed with the patient.  Questions were invited and answered.  Informed consent was obtained.  DESCRIPTION:  The patient was taken to the operating room and placed supine on the operating table.  General endotracheal tube anesthesia was administered by the anesthesiologist.  The patient was positioned and prepped and draped in a standard fashion for adenotonsillectomy.  A Crowe-Davis mouth gag was inserted into the oral cavity for exposure. 2+ cryptic tonsils were noted bilaterally.  No bifidity was noted.  Indirect mirror examination of the nasopharynx revealed mild adenoid hypertrophy. The adenoid was ablated with the Coblator device. Hemostasis was achieved with the Coblator device.  The right tonsil was then grasped with a straight Allis clamp and retracted medially.  It was resected free from the  underlying pharyngeal constrictor muscles with the Coblator device.  The same procedure was repeated on the left side without exception.  The surgical sites were copiously irrigated.  The mouth gag was removed.  The care of the patient was turned over to the anesthesiologist.  The patient was awakened from anesthesia without difficulty.  The patient was extubated and transferred to the recovery room in good condition.  OPERATIVE FINDINGS:  Adenotonsillar hypertrophy.  SPECIMEN:  None.  FOLLOWUP CARE:  The patient will be discharged home once awake and alert.  She will be placed on amoxicillin 800 mg p.o. b.i.d. for 5 days, and oxycodone 5-33ml po q 4 hours for postop pain control.   The patient will follow up in my office in approximately 2 weeks.  Donavan Kerlin W Aleeha Boline 03/03/2019 8:35 AM

## 2019-03-03 NOTE — Anesthesia Procedure Notes (Signed)
Procedure Name: Intubation Performed by: Nelson Noone M, CRNA Pre-anesthesia Checklist: Patient identified, Emergency Drugs available, Suction available, Patient being monitored and Timeout performed Patient Re-evaluated:Patient Re-evaluated prior to induction Oxygen Delivery Method: Circle system utilized Preoxygenation: Pre-oxygenation with 100% oxygen Induction Type: IV induction Ventilation: Mask ventilation without difficulty Laryngoscope Size: Mac and 3 Grade View: Grade I Tube type: Oral Tube size: 7.0 mm Number of attempts: 1 Airway Equipment and Method: Stylet Placement Confirmation: ETT inserted through vocal cords under direct vision,  positive ETCO2,  CO2 detector and breath sounds checked- equal and bilateral Secured at: 20 cm Tube secured with: Tape Dental Injury: Teeth and Oropharynx as per pre-operative assessment        

## 2019-03-03 NOTE — Transfer of Care (Signed)
Immediate Anesthesia Transfer of Care Note  Patient: Jennifer Gamble  Procedure(s) Performed: TONSILLECTOMY AND ADENOIDECTOMY (Bilateral Mouth)  Patient Location: PACU  Anesthesia Type:General  Level of Consciousness: awake, alert  and oriented  Airway & Oxygen Therapy: Patient Spontanous Breathing and Patient connected to face mask oxygen  Post-op Assessment: Report given to RN and Post -op Vital signs reviewed and stable  Post vital signs: Reviewed and stable  Last Vitals:  Vitals Value Taken Time  BP    Temp    Pulse    Resp 23 03/03/19 0843  SpO2    Vitals shown include unvalidated device data.  Last Pain:  Vitals:   03/03/19 0652  TempSrc: Oral  PainSc: 0-No pain         Complications: No apparent anesthesia complications

## 2019-03-03 NOTE — Anesthesia Postprocedure Evaluation (Signed)
Anesthesia Post Note  Patient: Jennifer Gamble  Procedure(s) Performed: TONSILLECTOMY AND ADENOIDECTOMY (Bilateral Mouth)     Patient location during evaluation: PACU Anesthesia Type: General Level of consciousness: awake and alert Pain management: pain level controlled Vital Signs Assessment: post-procedure vital signs reviewed and stable Respiratory status: spontaneous breathing, nonlabored ventilation, respiratory function stable and patient connected to nasal cannula oxygen Cardiovascular status: blood pressure returned to baseline and stable Postop Assessment: no apparent nausea or vomiting Anesthetic complications: no    Last Vitals:  Vitals:   03/03/19 0915 03/03/19 1015  BP: 106/72 118/78  Pulse: 71 81  Resp: 16 16  Temp:  36.8 C  SpO2: 95% 98%    Last Pain:  Vitals:   03/03/19 1015  TempSrc:   PainSc: 4                  Addie Alonge DAVID

## 2019-03-03 NOTE — Anesthesia Preprocedure Evaluation (Signed)
Anesthesia Evaluation  Patient identified by MRN, date of birth, ID band Patient awake    Reviewed: Allergy & Precautions, NPO status , Patient's Chart, lab work & pertinent test results  Airway Mallampati: I  TM Distance: >3 FB Neck ROM: Full    Dental   Pulmonary    Pulmonary exam normal        Cardiovascular Normal cardiovascular exam     Neuro/Psych Depression    GI/Hepatic GERD  Medicated and Controlled,  Endo/Other    Renal/GU      Musculoskeletal   Abdominal   Peds  Hematology   Anesthesia Other Findings   Reproductive/Obstetrics                             Anesthesia Physical Anesthesia Plan  ASA: II  Anesthesia Plan: General   Post-op Pain Management:    Induction: Intravenous  PONV Risk Score and Plan: 3 and Midazolam, Dexamethasone and Ondansetron  Airway Management Planned: Oral ETT  Additional Equipment:   Intra-op Plan:   Post-operative Plan: Extubation in OR  Informed Consent: I have reviewed the patients History and Physical, chart, labs and discussed the procedure including the risks, benefits and alternatives for the proposed anesthesia with the patient or authorized representative who has indicated his/her understanding and acceptance.       Plan Discussed with: CRNA and Surgeon  Anesthesia Plan Comments:         Anesthesia Quick Evaluation

## 2019-03-06 ENCOUNTER — Encounter (HOSPITAL_BASED_OUTPATIENT_CLINIC_OR_DEPARTMENT_OTHER): Payer: Self-pay | Admitting: Otolaryngology

## 2019-03-12 ENCOUNTER — Ambulatory Visit (HOSPITAL_COMMUNITY)
Admission: EM | Admit: 2019-03-12 | Discharge: 2019-03-13 | Disposition: A | Discharge status: Home / Self Care | Payer: BC Managed Care – PPO | Attending: Emergency Medicine | Admitting: Emergency Medicine

## 2019-03-12 ENCOUNTER — Encounter (HOSPITAL_COMMUNITY)
Admission: EM | Disposition: A | Discharge status: Home / Self Care | Payer: Self-pay | Source: Home / Self Care | Attending: Emergency Medicine

## 2019-03-12 ENCOUNTER — Emergency Department (HOSPITAL_COMMUNITY): Payer: BC Managed Care – PPO | Admitting: Anesthesiology

## 2019-03-12 ENCOUNTER — Other Ambulatory Visit: Payer: Self-pay

## 2019-03-12 DIAGNOSIS — K91840 Postprocedural hemorrhage and hematoma of a digestive system organ or structure following a digestive system procedure: Secondary | ICD-10-CM | POA: Diagnosis not present

## 2019-03-12 DIAGNOSIS — Z791 Long term (current) use of non-steroidal anti-inflammatories (NSAID): Secondary | ICD-10-CM | POA: Diagnosis not present

## 2019-03-12 DIAGNOSIS — R112 Nausea with vomiting, unspecified: Secondary | ICD-10-CM | POA: Insufficient documentation

## 2019-03-12 DIAGNOSIS — Z8249 Family history of ischemic heart disease and other diseases of the circulatory system: Secondary | ICD-10-CM | POA: Insufficient documentation

## 2019-03-12 DIAGNOSIS — K219 Gastro-esophageal reflux disease without esophagitis: Secondary | ICD-10-CM | POA: Insufficient documentation

## 2019-03-12 DIAGNOSIS — J95830 Postprocedural hemorrhage and hematoma of a respiratory system organ or structure following a respiratory system procedure: Secondary | ICD-10-CM | POA: Diagnosis not present

## 2019-03-12 DIAGNOSIS — Y839 Surgical procedure, unspecified as the cause of abnormal reaction of the patient, or of later complication, without mention of misadventure at the time of the procedure: Secondary | ICD-10-CM | POA: Insufficient documentation

## 2019-03-12 DIAGNOSIS — Z03818 Encounter for observation for suspected exposure to other biological agents ruled out: Secondary | ICD-10-CM | POA: Diagnosis not present

## 2019-03-12 DIAGNOSIS — Z20828 Contact with and (suspected) exposure to other viral communicable diseases: Secondary | ICD-10-CM | POA: Insufficient documentation

## 2019-03-12 DIAGNOSIS — F329 Major depressive disorder, single episode, unspecified: Secondary | ICD-10-CM | POA: Diagnosis not present

## 2019-03-12 DIAGNOSIS — E282 Polycystic ovarian syndrome: Secondary | ICD-10-CM | POA: Insufficient documentation

## 2019-03-12 HISTORY — PX: INCISION AND DRAINAGE OF PERITONSILLAR ABCESS: SHX6257

## 2019-03-12 LAB — TYPE AND SCREEN
ABO/RH(D): O POS
Antibody Screen: NEGATIVE

## 2019-03-12 LAB — COMPREHENSIVE METABOLIC PANEL
ALT: 10 U/L (ref 0–44)
AST: 18 U/L (ref 15–41)
Albumin: 4.1 g/dL (ref 3.5–5.0)
Alkaline Phosphatase: 51 U/L (ref 38–126)
Anion gap: 12 (ref 5–15)
BUN: 13 mg/dL (ref 6–20)
CO2: 21 mmol/L — ABNORMAL LOW (ref 22–32)
Calcium: 8.9 mg/dL (ref 8.9–10.3)
Chloride: 102 mmol/L (ref 98–111)
Creatinine, Ser: 0.71 mg/dL (ref 0.44–1.00)
GFR calc Af Amer: >60 mL/min (ref >60)
GFR calc non Af Amer: >60 mL/min (ref >60)
Glucose, Bld: 113 mg/dL — ABNORMAL HIGH (ref 70–99)
Potassium: 4 mmol/L (ref 3.5–5.1)
Sodium: 135 mmol/L (ref 135–145)
Total Bilirubin: 0.4 mg/dL (ref 0.3–1.2)
Total Protein: 7.2 g/dL (ref 6.5–8.1)

## 2019-03-12 LAB — SARS CORONAVIRUS 2 BY RT PCR (HOSPITAL ORDER, PERFORMED IN ~~LOC~~ HOSPITAL LAB): SARS Coronavirus 2: NEGATIVE

## 2019-03-12 LAB — CBC
HCT: 40.5 % (ref 36.0–46.0)
Hemoglobin: 13.6 g/dL (ref 12.0–15.0)
MCH: 31 pg (ref 26.0–34.0)
MCHC: 33.6 g/dL (ref 30.0–36.0)
MCV: 92.3 fL (ref 80.0–100.0)
Platelets: 319 10*3/uL (ref 150–400)
RBC: 4.39 MIL/uL (ref 3.87–5.11)
RDW: 11.2 % — ABNORMAL LOW (ref 11.5–15.5)
WBC: 7 10*3/uL (ref 4.0–10.5)
nRBC: 0 % (ref 0.0–0.2)

## 2019-03-12 LAB — ABO/RH: ABO/RH(D): O POS

## 2019-03-12 LAB — I-STAT BETA HCG BLOOD, ED (MC, WL, AP ONLY): I-stat hCG, quantitative: <5 m[IU]/mL (ref <5)

## 2019-03-12 SURGERY — INCISION AND DRAINAGE, ABSCESS, PERITONSILLAR
Anesthesia: General | Site: Throat

## 2019-03-12 MED ORDER — FENTANYL CITRATE (PF) 250 MCG/5ML IJ SOLN
INTRAMUSCULAR | Status: DC | PRN
  Administered 2019-03-12: 50 ug via INTRAVENOUS

## 2019-03-12 MED ORDER — OXYCODONE HCL 5 MG/5ML PO SOLN
5.0000 mg | ORAL | Status: AC | PRN
  Administered 2019-03-12: 5 mg via ORAL

## 2019-03-12 MED ORDER — PROPOFOL 10 MG/ML IV BOLUS
INTRAVENOUS | Status: AC
  Filled 2019-03-12: qty 20

## 2019-03-12 MED ORDER — DEXAMETHASONE SODIUM PHOSPHATE 10 MG/ML IJ SOLN
INTRAMUSCULAR | Status: DC | PRN
  Administered 2019-03-12: 10 mg via INTRAVENOUS

## 2019-03-12 MED ORDER — LIDOCAINE HCL (CARDIAC) PF 100 MG/5ML IV SOSY
PREFILLED_SYRINGE | INTRAVENOUS | Status: DC | PRN
  Administered 2019-03-12: 60 mg via INTRATRACHEAL

## 2019-03-12 MED ORDER — SODIUM CHLORIDE 0.9 % IV BOLUS: 500.0000 mL | Freq: Once | INTRAVENOUS | Status: DC

## 2019-03-12 MED ORDER — SODIUM CHLORIDE 0.9 % IV BOLUS
500.0000 mL | Freq: Once | INTRAVENOUS | Status: AC
  Administered 2019-03-12: 500 mL via INTRAVENOUS

## 2019-03-12 MED ORDER — MIDAZOLAM HCL 2 MG/2ML IJ SOLN
INTRAMUSCULAR | Status: DC | PRN
  Administered 2019-03-12: 2 mg via INTRAVENOUS

## 2019-03-12 MED ORDER — PROPOFOL 10 MG/ML IV BOLUS
INTRAVENOUS | Status: DC | PRN
  Administered 2019-03-12: 130 mg via INTRAVENOUS

## 2019-03-12 MED ORDER — DEXAMETHASONE SODIUM PHOSPHATE 10 MG/ML IJ SOLN
INTRAMUSCULAR | Status: AC
  Filled 2019-03-12: qty 1

## 2019-03-12 MED ORDER — SODIUM CHLORIDE 0.9 % IR SOLN
Status: DC | PRN
  Administered 2019-03-12: 1000 mL

## 2019-03-12 MED ORDER — SUCCINYLCHOLINE 20MG/ML (10ML) SYRINGE FOR MEDFUSION PUMP - OPTIME
INTRAMUSCULAR | Status: DC | PRN
  Administered 2019-03-12: 100 mg via INTRAVENOUS

## 2019-03-12 MED ORDER — ONDANSETRON HCL 4 MG/2ML IJ SOLN
INTRAMUSCULAR | Status: AC
  Filled 2019-03-12: qty 2

## 2019-03-12 MED ORDER — PROMETHAZINE HCL 25 MG/ML IJ SOLN: 6.2500 mg | INTRAMUSCULAR | Status: DC | PRN

## 2019-03-12 MED ORDER — LACTATED RINGERS IV SOLN
INTRAVENOUS | Status: DC | PRN
  Administered 2019-03-12: 22:00:00 via INTRAVENOUS

## 2019-03-12 MED ORDER — FENTANYL CITRATE (PF) 100 MCG/2ML IJ SOLN
INTRAMUSCULAR | Status: AC
  Administered 2019-03-12: 50 ug via INTRAVENOUS
  Filled 2019-03-12: qty 2

## 2019-03-12 MED ORDER — LIDOCAINE 2% (20 MG/ML) 5 ML SYRINGE
INTRAMUSCULAR | Status: AC
  Filled 2019-03-12: qty 5

## 2019-03-12 MED ORDER — ONDANSETRON HCL 4 MG/2ML IJ SOLN
INTRAMUSCULAR | Status: DC | PRN
  Administered 2019-03-12: 4 mg via INTRAVENOUS

## 2019-03-12 MED ORDER — OXYCODONE HCL 5 MG/5ML PO SOLN
ORAL | Status: AC
  Filled 2019-03-12: qty 5

## 2019-03-12 MED ORDER — FENTANYL CITRATE (PF) 100 MCG/2ML IJ SOLN
25.0000 ug | INTRAMUSCULAR | Status: DC | PRN
  Administered 2019-03-12 (×2): 50 ug via INTRAVENOUS

## 2019-03-12 MED ORDER — SUCCINYLCHOLINE CHLORIDE 200 MG/10ML IV SOSY
PREFILLED_SYRINGE | INTRAVENOUS | Status: AC
  Filled 2019-03-12: qty 10

## 2019-03-12 MED ORDER — 0.9 % SODIUM CHLORIDE (POUR BTL) OPTIME
TOPICAL | Status: DC | PRN
  Administered 2019-03-12: 1000 mL

## 2019-03-12 MED ORDER — MIDAZOLAM HCL 2 MG/2ML IJ SOLN
INTRAMUSCULAR | Status: AC
  Filled 2019-03-12: qty 2

## 2019-03-12 MED ORDER — FENTANYL CITRATE (PF) 250 MCG/5ML IJ SOLN
INTRAMUSCULAR | Status: AC
  Filled 2019-03-12: qty 5

## 2019-03-12 SURGICAL SUPPLY — 26 items
CANISTER SUCT 3000ML PPV (MISCELLANEOUS) IMPLANT
CATH ROBINSON RED A/P 10FR (CATHETERS) IMPLANT
COAGULATOR SUCT SWTCH 10FR 6 (ELECTROSURGICAL) IMPLANT
COVER WAND RF STERILE (DRAPES) IMPLANT
ELECT COATED BLADE 2.86 ST (ELECTRODE) ×2 IMPLANT
ELECT REM PT RETURN 9FT ADLT (ELECTROSURGICAL) ×2
ELECT REM PT RETURN 9FT PED (ELECTROSURGICAL)
ELECTRODE REM PT RETRN 9FT PED (ELECTROSURGICAL) IMPLANT
ELECTRODE REM PT RTRN 9FT ADLT (ELECTROSURGICAL) ×1 IMPLANT
GAUZE 4X4 16PLY RFD (DISPOSABLE) ×2 IMPLANT
GLOVE ECLIPSE 7.5 STRL STRAW (GLOVE) ×2 IMPLANT
GLOVE INDICATOR 7.0 STRL GRN (GLOVE) ×2 IMPLANT
GLOVE SURG SS PI 7.0 STRL IVOR (GLOVE) ×2 IMPLANT
GOWN STRL REUS W/ TWL LRG LVL3 (GOWN DISPOSABLE) ×2 IMPLANT
GOWN STRL REUS W/TWL LRG LVL3 (GOWN DISPOSABLE) ×2
KIT BASIN OR (CUSTOM PROCEDURE TRAY) ×2 IMPLANT
KIT TURNOVER KIT B (KITS) ×2 IMPLANT
NS IRRIG 1000ML POUR BTL (IV SOLUTION) ×2 IMPLANT
PACK SURGICAL SETUP 50X90 (CUSTOM PROCEDURE TRAY) ×2 IMPLANT
PAD ARMBOARD 7.5X6 YLW CONV (MISCELLANEOUS) ×4 IMPLANT
SPONGE TONSIL TAPE 1 RFD (DISPOSABLE) ×2 IMPLANT
SYR BULB 3OZ (MISCELLANEOUS) ×2 IMPLANT
TOWEL GREEN STERILE FF (TOWEL DISPOSABLE) ×2 IMPLANT
TUBE CONNECTING 12X1/4 (SUCTIONS) ×2 IMPLANT
TUBE SALEM SUMP 12R W/ARV (TUBING) IMPLANT
WAND COBLATOR 70 EVAC XTRA (SURGICAL WAND) ×2 IMPLANT

## 2019-03-12 NOTE — Discharge Instructions (Addendum)
[facility] M[facility] °Postoperative Instructions for Tonsillectomy & Adenoidectomy (T&A) °Activity °Restrict activity at home for the first two days, resting as much as possible. Light indoor activity is best. You may usually return to school or work within a week but void strenuous activity and sports for two weeks. Sleep with your head elevated on 2-3 pillows for 3-4 days to help decrease swelling. °Diet °Due to tissue swelling and throat discomfort, you may have little desire to drink for several days. However fluids are very important to prevent dehydration. You will find that non-acidic juices, soups, popsicles, Jell-O, custard, puddings, and any soft or mashed foods taken in small quantities can be swallowed fairly easily. Try to increase your fluid and food intake as the discomfort subsides. It is recommended that a child receive 1-1/2 quarts of fluid in a 24-hour period. Adult require twice this amount.  °Discomfort °Your sore throat may be relieved by applying an ice collar to your neck and/or by taking Tylenol®. You may experience an earache, which is due to referred pain from the throat. Referred ear pain is commonly felt at night when trying to rest. ° °Bleeding                        Although rare, there is risk of having some bleeding during the first 2 weeks after having a T&A. This usually happens between days 7-10 postoperatively. If you or your child should have any bleeding, try to remain calm. We recommend sitting up quietly in a chair and gently spitting out the blood into a bowl. For adults, gargling gently with ice water may help. If the bleeding does not stop after a short time (5 minutes), is more than 1 teaspoonful, or if you become worried, please call our office at [phone] or go directly to the nearest hospital emergency room. Do not eat or drink anything prior to going to the hospital as you may need to be taken to the operating room in order to control the bleeding. °GENERAL  CONSIDERATIONS °1. Brush your teeth regularly. Avoid mouthwashes and gargles for three weeks. You may gargle gently with warm salt-water as necessary or spray with Chloraseptic®. You may make salt-water by placing 2 teaspoons of table salt into a quart of fresh water. Warm the salt-water in a microwave to a luke warm temperature.  °2. Avoid exposure to colds and upper respiratory infections if possible.  °3. If you look into a mirror or into your child's mouth, you will see white-gray patches in the back of the throat. This is normal after having a T&A and is like a scab that forms on the skin after an abrasion. It will disappear once the back of the throat heals completely. However, it may cause a noticeable odor; this too will disappear with time. Again, warm salt-water gargles may be used to help keep the throat clean and promote healing.  °4. You may notice a temporary change in voice quality, such as a higher pitched voice or a nasal sound, until healing is complete. This may last for 1-2 weeks and should resolve.  °5. Do not take or give you child any medications that we have not prescribed or recommended.  °6. Snoring may occur, especially at night, for the first week after a T&A. It is due to swelling of the soft palate and will usually resolve.  °Please call our office at [phone] if you have any questions.   °

## 2019-03-12 NOTE — Anesthesia Procedure Notes (Signed)
Procedure Name: Intubation Date/Time: 03/12/2019 10:30 PM Performed by: Valetta Fuller, CRNA Pre-anesthesia Checklist: Patient identified, Emergency Drugs available, Suction available and Patient being monitored Patient Re-evaluated:Patient Re-evaluated prior to induction Oxygen Delivery Method: Circle system utilized Preoxygenation: Pre-oxygenation with 100% oxygen Induction Type: IV induction, Rapid sequence and Cricoid Pressure applied Laryngoscope Size: Miller and 2 Grade View: Grade I Tube type: Oral Tube size: 7.0 mm Number of attempts: 1 Airway Equipment and Method: Stylet Dental Injury: Teeth and Oropharynx as per pre-operative assessment

## 2019-03-12 NOTE — Anesthesia Postprocedure Evaluation (Signed)
Anesthesia Post Note  Patient: Jennifer Gamble  Procedure(s) Performed: CONTROL OF OROPHARYNGEAL BLEEDING (N/A Throat)     Patient location during evaluation: PACU Anesthesia Type: General Level of consciousness: awake and alert Pain management: pain level controlled Vital Signs Assessment: post-procedure vital signs reviewed and stable Respiratory status: spontaneous breathing, nonlabored ventilation, respiratory function stable and patient connected to nasal cannula oxygen Cardiovascular status: blood pressure returned to baseline and stable Postop Assessment: no apparent nausea or vomiting Anesthetic complications: no    Last Vitals:  Vitals:   03/12/19 2330 03/12/19 2337  BP: 106/65   Pulse: 70 73  Resp: 15   Temp:    SpO2: 97% 100%    Last Pain:  Vitals:   03/12/19 2315  TempSrc:   PainSc: 6                  Tiajuana Amass

## 2019-03-12 NOTE — Anesthesia Preprocedure Evaluation (Signed)
Anesthesia Evaluation  Patient identified by MRN, date of birth, ID band Patient awake    Reviewed: Allergy & Precautions, NPO status , Patient's Chart, lab work & pertinent test results  Airway Mallampati: I  TM Distance: >3 FB Neck ROM: Full    Dental  (+) Dental Advisory Given   Pulmonary    Pulmonary exam normal        Cardiovascular Normal cardiovascular exam     Neuro/Psych Depression    GI/Hepatic GERD  Medicated and Controlled,  Endo/Other    Renal/GU      Musculoskeletal   Abdominal   Peds  Hematology Bleeding tonsil.   Anesthesia Other Findings   Reproductive/Obstetrics                             Lab Results  Component Value Date   WBC 7.0 03/12/2019   HGB 13.6 03/12/2019   HCT 40.5 03/12/2019   MCV 92.3 03/12/2019   PLT 319 03/12/2019   Lab Results  Component Value Date   CREATININE 0.71 03/12/2019   BUN 13 03/12/2019   NA 135 03/12/2019   K 4.0 03/12/2019   CL 102 03/12/2019   CO2 21 (L) 03/12/2019    Anesthesia Physical  Anesthesia Plan  ASA: II and emergent  Anesthesia Plan: General   Post-op Pain Management:    Induction: Intravenous and Rapid sequence  PONV Risk Score and Plan: 3 and Dexamethasone, Ondansetron and Treatment may vary due to age or medical condition  Airway Management Planned: Oral ETT  Additional Equipment:   Intra-op Plan:   Post-operative Plan: Extubation in OR  Informed Consent: I have reviewed the patients History and Physical, chart, labs and discussed the procedure including the risks, benefits and alternatives for the proposed anesthesia with the patient or authorized representative who has indicated his/her understanding and acceptance.       Plan Discussed with: CRNA and Surgeon  Anesthesia Plan Comments:         Anesthesia Quick Evaluation

## 2019-03-12 NOTE — ED Notes (Signed)
Pt reports unwitnessed in the restoom. From pt story is sounds like a syncope episode. She awoke and returned to her room. She reports to not be in any pain. MD notified

## 2019-03-12 NOTE — Op Note (Signed)
DATE OF PROCEDURE:  03/12/2019                              OPERATIVE REPORT  SURGEON:  Leta Baptist, MD  PREOPERATIVE DIAGNOSES: 1. Post tonsillectomy hemorrhage.  POSTOPERATIVE DIAGNOSES: 1. Post tonsillectomy hemorrhage.  PROCEDURE PERFORMED: Operative control of oropharyngeal hemorrhage.  ANESTHESIA:  General endotracheal tube anesthesia.  COMPLICATIONS:  None.  ESTIMATED BLOOD LOSS:  Minimal.  INDICATION FOR PROCEDURE:  Jennifer Gamble is a 33 y.o. female who underwent adenotonsillectomy surgery 9 days ago.  The patient was doing well until earlier today, when she noted a large amount of bleeding from her left tonsillar fossa.  The bleeding persisted despite conservative measures and cold water irrigation.  Based on the above findings, the decision was made for the patient to undergo the above-stated procedure.  The risks, benefits, alternatives, and details of the procedure were discussed with the patient.  Questions were invited and answered.  Informed consent was obtained.  DESCRIPTION:  The patient was taken to the operating room and placed supine on the operating table.  General endotracheal tube anesthesia was administered by the anesthesiologist.  The patient was positioned and prepped and draped in a standard fashion for oral surgery.  A Crowe-Davis mouth gag was inserted into the oral cavity for exposure.  A large blood clot was noted to cover the left tonsillar fossa.  The blood clot was evacuated.  A bleeding source was noted at the left inferior tonsillar pole.  The bleeding was controlled with the Coblator device.  No bleeding area was noted on the right tonsillar fossa or the adenoid bed.  The surgical sites were copiously irrigated.  The mouth gag was removed.  The care of the patient was turned over to the anesthesiologist.  The patient was awakened from anesthesia without difficulty.  The patient was extubated and transferred to the recovery room in good  condition.  OPERATIVE FINDINGS: A bleeding source was noted on the left tonsillar fossa.  SPECIMEN:  None  FOLLOWUP CARE:  The patient will be discharged home once awake and alert.  The patient will follow up in my office in 1 week.  Tammey Deeg W Samina Weekes 03/12/2019 10:51 PM

## 2019-03-12 NOTE — Interval H&P Note (Signed)
History and Physical Interval Note:  03/12/2019 9:45 PM  Jennifer Gamble  has presented today for surgery, with the diagnosis of Tonsil bleeds.  The various methods of treatment have been discussed with the patient and family. After consideration of risks, benefits and other options for treatment, the patient has consented to  Procedure(s): HEMOSTASIS OF TONSIL BLEED (N/A) as a surgical intervention.  The patient's history has been reviewed, patient examined, no change in status, stable for surgery.  I have reviewed the patient's chart and labs.  Questions were answered to the patient's satisfaction.     Malcom Selmer Cassie Freer

## 2019-03-12 NOTE — ED Provider Notes (Signed)
[facility]   CSN: [id number] Arrival date & time: 03/12/19  1932     History   Chief Complaint Chief Complaint  Patient presents with  . Hematemesis    HPI Jennifer Gamble is a 33 y.o. female.     Patient presents to the emergency department with complaint of uncontrolled bleeding to the pharynx after tonsillectomy and adenoidectomy performed on 03/03/2019.  Patient states that she developed bleeding around noon today.  Bleeding was heavy for about 10 minutes but then stopped.  Bleeding resumed later in the afternoon and she has had a couple of hours of continued oozing from the left side of her oropharynx.  Patient has had some nausea and vomiting after swallowing blood.  She is continuously bloody secretions into a bag.  No lightheadedness, CP/SOB.  She rinsed her throat with water at home after calling office. She also applied ice to her with with the help of a friend. No bleeding difficulties or history blood dyscrasia. She has been taking ibuprofen for pain.      Past Medical History:  Diagnosis Date  . Abnormal Pap smear    CIN II  . Acid reflux   . Depression   . Polycystic ovarian disease   . Ulcer     There are no active problems to display for this patient.   Past Surgical History:  Procedure Laterality Date  . GYNECOLOGIC CRYOSURGERY    . TONSILLECTOMY AND ADENOIDECTOMY Bilateral 03/03/2019   Procedure: TONSILLECTOMY AND ADENOIDECTOMY;  Surgeon: Newman Pieseoh, Su, MD;  Location: [facility];  Service: ENT;  Laterality: Bilateral;  . WISDOM TOOTH EXTRACTION       OB History    Gravida  2   Para  1   Term  1   Preterm  0   AB  1   Living  1     SAB  0   TAB  1   Ectopic  0   Multiple  0   Live Births               Home Medications    Prior to Admission medications   Medication Sig Start Date End Date Taking? Authorizing Provider  ibuprofen (ADVIL,MOTRIN) 200 MG tablet  Take 400 mg by mouth 2 (two) times daily as needed. Cramping.     [provider]  methocarbamol (ROBAXIN) 500 MG tablet Take 500 mg by mouth 4 (four) times daily.    Alver FisherLindner, Jodi N, RN    Family History Family History  Problem Relation Age of Onset  . Diabetes Mother   . Hypertension Mother     Social History Social History   Tobacco Use  . Smoking status: Never Smoker  . Smokeless tobacco: Never Used  Substance Use Topics  . Alcohol use: Yes    Alcohol/week: 2.0 standard drinks    Types: 2 Standard drinks or equivalent per week  . Drug use: No     Allergies   Patient has no known allergies.   Review of Systems Review of Systems  Constitutional: Negative for fever.  HENT: Positive for sore throat. Negative for rhinorrhea.   Eyes: Negative for redness.  Respiratory: Negative for shortness of breath.   Cardiovascular: Negative for chest pain.  Gastrointestinal: Positive for nausea and vomiting. Negative for abdominal pain and diarrhea.  Genitourinary: Negative for dysuria.  Musculoskeletal: Negative for myalgias.  Skin: Negative for rash.  Neurological: Negative for syncope, light-headedness and  headaches.     Physical Exam Updated Vital Signs BP 104/62   Pulse 96   Temp 98.7 F (37.1 C) (Oral)   Resp 20   Ht 5\' 5"  (1.651 m)   Wt 72.6 kg   LMP 03/04/2019 (Exact Date)   SpO2 100%   BMI 26.63 kg/m   Physical Exam Vitals signs and nursing note reviewed.  Constitutional:      Appearance: She is well-developed.  HENT:     Head: Normocephalic and atraumatic.     Mouth/Throat:     Comments: Blood in oropharynx. Bleeding appears to becoming from L tonsillar excision site.  Eyes:     General:        Right eye: No discharge.        Left eye: No discharge.     Conjunctiva/sclera: Conjunctivae normal.  Neck:     Musculoskeletal: Normal range of motion and neck supple.  Cardiovascular:     Rate and Rhythm: Regular rhythm. Tachycardia present.      Heart sounds: Normal heart sounds.  Pulmonary:     Effort: Pulmonary effort is normal.     Breath sounds: Normal breath sounds.  Abdominal:     Palpations: Abdomen is soft.     Tenderness: There is no abdominal tenderness.  Skin:    General: Skin is warm and dry.  Neurological:     Mental Status: She is alert.      ED Treatments / Results  Labs (all labs ordered are listed, but only abnormal results are displayed) Labs Reviewed  COMPREHENSIVE METABOLIC PANEL - Abnormal; Notable for the following components:      Result Value   CO2 21 (*)    Glucose, Bld 113 (*)    All other components within normal limits  CBC - Abnormal; Notable for the following components:   RDW 11.2 (*)    All other components within normal limits  SARS CORONAVIRUS 2 (HOSPITAL ORDER, PERFORMED IN [facility])  I-STAT BETA HCG BLOOD, ED ([facility], [facility], [facility] ONLY)  TYPE AND SCREEN    EKG None  Radiology No results found.  Procedures Procedures (including critical care time)  Medications Ordered in ED Medications - No data to display   Initial Impression / Assessment and Plan / ED Course  I have reviewed the triage vital signs and the nursing notes.  Pertinent labs & imaging results that were available during my care of the patient were reviewed by me and considered in my medical decision making (see chart for details).        Patient seen and examined. Work-up initiated. Will discuss with Dr. Suszanne Connerseoh.  Patient discussed with and seen by Dr. Madilyn Hookees. Patient is sitting in bed with a belonging bag containing an emesis bag. There is a couple cups of bloody secretion into the bag.   Vital signs reviewed and are as follows: BP (!) 135/108   Pulse (!) 112   Temp 98.7 F (37.1 C) (Oral)   Resp 20   Ht 5\' 5"  (1.651 m)   Wt 72.6 kg   LMP 03/04/2019 (Exact Date)   SpO2 99%   BMI 26.63 kg/m   8:24 PM Spoke with Dr. Suszanne Connerseoh. He plans to take patient to OR for cautery. Pt updated, is NPO. No  solids since noon.   Final Clinical Impressions(s) / ED Diagnoses   Final diagnoses:  Post-tonsillectomy hemorrhage   Admit.   ED Discharge Orders    None  Carlisle Cater, PA-C 03/13/19 2304    Quintella Reichert, MD 03/15/19 1045

## 2019-03-12 NOTE — Transfer of Care (Signed)
Immediate Anesthesia Transfer of Care Note  Patient: Jennifer Gamble  Procedure(s) Performed: CONTROL OF OROPHARYNGEAL BLEEDING (N/A Throat)  Patient Location: PACU  Anesthesia Type:General  Level of Consciousness: awake, alert  and oriented  Airway & Oxygen Therapy: Patient Spontanous Breathing  Post-op Assessment: Report given to RN and Post -op Vital signs reviewed and stable  Post vital signs: Reviewed and stable  Last Vitals:  Vitals Value Taken Time  BP 83/70 03/12/19 2300  Temp    Pulse 100 03/12/19 2301  Resp 16 03/12/19 2301  SpO2 100 % 03/12/19 2301  Vitals shown include unvalidated device data.  Last Pain:  Vitals:   03/12/19 2150  TempSrc:   PainSc: 0-No pain         Complications: No apparent anesthesia complications

## 2019-03-12 NOTE — ED Triage Notes (Signed)
Pt had tonsillectomy on 14/60/4799 - no complications. Bleeding/vomiting blood today (and in triage).

## 2019-03-13 ENCOUNTER — Encounter (HOSPITAL_COMMUNITY): Payer: Self-pay | Admitting: Otolaryngology

## 2019-04-26 DIAGNOSIS — Z6828 Body mass index (BMI) 28.0-28.9, adult: Secondary | ICD-10-CM | POA: Diagnosis not present

## 2019-04-26 DIAGNOSIS — Z3040 Encounter for surveillance of contraceptives, unspecified: Secondary | ICD-10-CM | POA: Diagnosis not present

## 2019-04-26 DIAGNOSIS — R350 Frequency of micturition: Secondary | ICD-10-CM | POA: Diagnosis not present

## 2019-04-26 DIAGNOSIS — N898 Other specified noninflammatory disorders of vagina: Secondary | ICD-10-CM | POA: Diagnosis not present

## 2019-04-26 DIAGNOSIS — Z01419 Encounter for gynecological examination (general) (routine) without abnormal findings: Secondary | ICD-10-CM | POA: Diagnosis not present

## 2019-05-03 DIAGNOSIS — Z872 Personal history of diseases of the skin and subcutaneous tissue: Secondary | ICD-10-CM | POA: Diagnosis not present

## 2019-05-03 DIAGNOSIS — L814 Other melanin hyperpigmentation: Secondary | ICD-10-CM | POA: Diagnosis not present

## 2019-05-03 DIAGNOSIS — D229 Melanocytic nevi, unspecified: Secondary | ICD-10-CM | POA: Diagnosis not present

## 2019-05-03 DIAGNOSIS — L819 Disorder of pigmentation, unspecified: Secondary | ICD-10-CM | POA: Diagnosis not present

## 2019-05-09 DIAGNOSIS — N898 Other specified noninflammatory disorders of vagina: Secondary | ICD-10-CM | POA: Diagnosis not present

## 2019-05-09 DIAGNOSIS — R350 Frequency of micturition: Secondary | ICD-10-CM | POA: Diagnosis not present

## 2019-06-27 DIAGNOSIS — Z20828 Contact with and (suspected) exposure to other viral communicable diseases: Secondary | ICD-10-CM | POA: Diagnosis not present

## 2019-07-05 DIAGNOSIS — G43119 Migraine with aura, intractable, without status migrainosus: Secondary | ICD-10-CM | POA: Diagnosis not present

## 2019-07-05 DIAGNOSIS — Z049 Encounter for examination and observation for unspecified reason: Secondary | ICD-10-CM | POA: Diagnosis not present

## 2019-07-05 DIAGNOSIS — Z79899 Other long term (current) drug therapy: Secondary | ICD-10-CM | POA: Diagnosis not present

## 2019-07-05 DIAGNOSIS — R519 Headache, unspecified: Secondary | ICD-10-CM | POA: Diagnosis not present

## 2019-07-05 DIAGNOSIS — G43719 Chronic migraine without aura, intractable, without status migrainosus: Secondary | ICD-10-CM | POA: Diagnosis not present

## 2019-07-05 DIAGNOSIS — G43839 Menstrual migraine, intractable, without status migrainosus: Secondary | ICD-10-CM | POA: Diagnosis not present

## 2019-07-06 DIAGNOSIS — G43839 Menstrual migraine, intractable, without status migrainosus: Secondary | ICD-10-CM | POA: Diagnosis not present

## 2019-07-06 DIAGNOSIS — M791 Myalgia, unspecified site: Secondary | ICD-10-CM | POA: Diagnosis not present

## 2019-07-06 DIAGNOSIS — M542 Cervicalgia: Secondary | ICD-10-CM | POA: Diagnosis not present

## 2019-07-06 DIAGNOSIS — G518 Other disorders of facial nerve: Secondary | ICD-10-CM | POA: Diagnosis not present

## 2019-07-06 DIAGNOSIS — G43119 Migraine with aura, intractable, without status migrainosus: Secondary | ICD-10-CM | POA: Diagnosis not present

## 2019-07-06 DIAGNOSIS — G43719 Chronic migraine without aura, intractable, without status migrainosus: Secondary | ICD-10-CM | POA: Diagnosis not present

## 2019-07-10 ENCOUNTER — Other Ambulatory Visit: Payer: Self-pay | Admitting: Specialist

## 2019-07-10 DIAGNOSIS — R519 Headache, unspecified: Secondary | ICD-10-CM

## 2019-07-19 DIAGNOSIS — G43119 Migraine with aura, intractable, without status migrainosus: Secondary | ICD-10-CM | POA: Diagnosis not present

## 2019-07-19 DIAGNOSIS — M791 Myalgia, unspecified site: Secondary | ICD-10-CM | POA: Diagnosis not present

## 2019-07-19 DIAGNOSIS — G518 Other disorders of facial nerve: Secondary | ICD-10-CM | POA: Diagnosis not present

## 2019-07-19 DIAGNOSIS — G43839 Menstrual migraine, intractable, without status migrainosus: Secondary | ICD-10-CM | POA: Diagnosis not present

## 2019-07-19 DIAGNOSIS — M542 Cervicalgia: Secondary | ICD-10-CM | POA: Diagnosis not present

## 2019-07-19 DIAGNOSIS — G43719 Chronic migraine without aura, intractable, without status migrainosus: Secondary | ICD-10-CM | POA: Diagnosis not present

## 2019-07-22 ENCOUNTER — Other Ambulatory Visit: Payer: Self-pay

## 2019-07-22 ENCOUNTER — Ambulatory Visit
Admission: RE | Admit: 2019-07-22 | Discharge: 2019-07-22 | Disposition: A | Discharge status: Home / Self Care | Payer: BC Managed Care – PPO | Source: Ambulatory Visit | Attending: Specialist | Admitting: Specialist

## 2019-07-22 DIAGNOSIS — R519 Headache, unspecified: Secondary | ICD-10-CM

## 2019-07-22 DIAGNOSIS — G43109 Migraine with aura, not intractable, without status migrainosus: Secondary | ICD-10-CM | POA: Diagnosis not present

## 2019-08-02 DIAGNOSIS — G43719 Chronic migraine without aura, intractable, without status migrainosus: Secondary | ICD-10-CM | POA: Diagnosis not present

## 2019-08-02 DIAGNOSIS — G518 Other disorders of facial nerve: Secondary | ICD-10-CM | POA: Diagnosis not present

## 2019-08-02 DIAGNOSIS — G43839 Menstrual migraine, intractable, without status migrainosus: Secondary | ICD-10-CM | POA: Diagnosis not present

## 2019-08-02 DIAGNOSIS — M791 Myalgia, unspecified site: Secondary | ICD-10-CM | POA: Diagnosis not present

## 2019-08-02 DIAGNOSIS — M542 Cervicalgia: Secondary | ICD-10-CM | POA: Diagnosis not present

## 2019-08-02 DIAGNOSIS — G43119 Migraine with aura, intractable, without status migrainosus: Secondary | ICD-10-CM | POA: Diagnosis not present

## 2019-08-16 DIAGNOSIS — G43119 Migraine with aura, intractable, without status migrainosus: Secondary | ICD-10-CM | POA: Diagnosis not present

## 2019-08-16 DIAGNOSIS — M791 Myalgia, unspecified site: Secondary | ICD-10-CM | POA: Diagnosis not present

## 2019-08-16 DIAGNOSIS — G518 Other disorders of facial nerve: Secondary | ICD-10-CM | POA: Diagnosis not present

## 2019-08-16 DIAGNOSIS — G43719 Chronic migraine without aura, intractable, without status migrainosus: Secondary | ICD-10-CM | POA: Diagnosis not present

## 2019-08-16 DIAGNOSIS — M542 Cervicalgia: Secondary | ICD-10-CM | POA: Diagnosis not present

## 2019-08-16 DIAGNOSIS — G43839 Menstrual migraine, intractable, without status migrainosus: Secondary | ICD-10-CM | POA: Diagnosis not present

## 2019-08-30 DIAGNOSIS — G518 Other disorders of facial nerve: Secondary | ICD-10-CM | POA: Diagnosis not present

## 2019-08-30 DIAGNOSIS — M542 Cervicalgia: Secondary | ICD-10-CM | POA: Diagnosis not present

## 2019-08-30 DIAGNOSIS — G43719 Chronic migraine without aura, intractable, without status migrainosus: Secondary | ICD-10-CM | POA: Diagnosis not present

## 2019-08-30 DIAGNOSIS — G501 Atypical facial pain: Secondary | ICD-10-CM | POA: Diagnosis not present

## 2019-08-30 DIAGNOSIS — G43119 Migraine with aura, intractable, without status migrainosus: Secondary | ICD-10-CM | POA: Diagnosis not present

## 2019-08-30 DIAGNOSIS — M791 Myalgia, unspecified site: Secondary | ICD-10-CM | POA: Diagnosis not present

## 2019-08-30 DIAGNOSIS — G43839 Menstrual migraine, intractable, without status migrainosus: Secondary | ICD-10-CM | POA: Diagnosis not present

## 2019-09-20 DIAGNOSIS — G518 Other disorders of facial nerve: Secondary | ICD-10-CM | POA: Diagnosis not present

## 2019-09-20 DIAGNOSIS — M791 Myalgia, unspecified site: Secondary | ICD-10-CM | POA: Diagnosis not present

## 2019-09-20 DIAGNOSIS — G43839 Menstrual migraine, intractable, without status migrainosus: Secondary | ICD-10-CM | POA: Diagnosis not present

## 2019-09-20 DIAGNOSIS — G43719 Chronic migraine without aura, intractable, without status migrainosus: Secondary | ICD-10-CM | POA: Diagnosis not present

## 2019-09-20 DIAGNOSIS — M542 Cervicalgia: Secondary | ICD-10-CM | POA: Diagnosis not present

## 2019-09-20 DIAGNOSIS — G43119 Migraine with aura, intractable, without status migrainosus: Secondary | ICD-10-CM | POA: Diagnosis not present

## 2019-11-13 DIAGNOSIS — G43719 Chronic migraine without aura, intractable, without status migrainosus: Secondary | ICD-10-CM | POA: Diagnosis not present

## 2019-11-13 DIAGNOSIS — G43839 Menstrual migraine, intractable, without status migrainosus: Secondary | ICD-10-CM | POA: Diagnosis not present

## 2019-11-13 DIAGNOSIS — G43119 Migraine with aura, intractable, without status migrainosus: Secondary | ICD-10-CM | POA: Diagnosis not present

## 2020-02-12 DIAGNOSIS — G43119 Migraine with aura, intractable, without status migrainosus: Secondary | ICD-10-CM | POA: Diagnosis not present

## 2020-02-12 DIAGNOSIS — G43839 Menstrual migraine, intractable, without status migrainosus: Secondary | ICD-10-CM | POA: Diagnosis not present

## 2020-02-12 DIAGNOSIS — G43719 Chronic migraine without aura, intractable, without status migrainosus: Secondary | ICD-10-CM | POA: Diagnosis not present

## 2020-03-13 DIAGNOSIS — Z0000 Encounter for general adult medical examination without abnormal findings: Secondary | ICD-10-CM | POA: Diagnosis not present

## 2020-03-13 DIAGNOSIS — R5383 Other fatigue: Secondary | ICD-10-CM | POA: Diagnosis not present

## 2020-03-13 DIAGNOSIS — Z13220 Encounter for screening for lipoid disorders: Secondary | ICD-10-CM | POA: Diagnosis not present

## 2020-04-23 DIAGNOSIS — F422 Mixed obsessional thoughts and acts: Secondary | ICD-10-CM | POA: Diagnosis not present

## 2020-04-23 DIAGNOSIS — Z630 Problems in relationship with spouse or partner: Secondary | ICD-10-CM | POA: Diagnosis not present

## 2020-04-30 DIAGNOSIS — F422 Mixed obsessional thoughts and acts: Secondary | ICD-10-CM | POA: Diagnosis not present

## 2020-04-30 DIAGNOSIS — Z630 Problems in relationship with spouse or partner: Secondary | ICD-10-CM | POA: Diagnosis not present

## 2020-05-06 DIAGNOSIS — Z630 Problems in relationship with spouse or partner: Secondary | ICD-10-CM | POA: Diagnosis not present

## 2020-05-06 DIAGNOSIS — F422 Mixed obsessional thoughts and acts: Secondary | ICD-10-CM | POA: Diagnosis not present

## 2020-05-13 DIAGNOSIS — Z01419 Encounter for gynecological examination (general) (routine) without abnormal findings: Secondary | ICD-10-CM | POA: Diagnosis not present

## 2020-05-13 DIAGNOSIS — Z6828 Body mass index (BMI) 28.0-28.9, adult: Secondary | ICD-10-CM | POA: Diagnosis not present

## 2020-05-13 DIAGNOSIS — R102 Pelvic and perineal pain: Secondary | ICD-10-CM | POA: Diagnosis not present

## 2020-05-13 DIAGNOSIS — Z3040 Encounter for surveillance of contraceptives, unspecified: Secondary | ICD-10-CM | POA: Diagnosis not present

## 2020-05-13 DIAGNOSIS — N930 Postcoital and contact bleeding: Secondary | ICD-10-CM | POA: Diagnosis not present

## 2020-05-13 DIAGNOSIS — Z124 Encounter for screening for malignant neoplasm of cervix: Secondary | ICD-10-CM | POA: Diagnosis not present

## 2020-05-14 DIAGNOSIS — F422 Mixed obsessional thoughts and acts: Secondary | ICD-10-CM | POA: Diagnosis not present

## 2020-05-14 DIAGNOSIS — Z630 Problems in relationship with spouse or partner: Secondary | ICD-10-CM | POA: Diagnosis not present

## 2020-05-21 DIAGNOSIS — Z630 Problems in relationship with spouse or partner: Secondary | ICD-10-CM | POA: Diagnosis not present

## 2020-05-21 DIAGNOSIS — F422 Mixed obsessional thoughts and acts: Secondary | ICD-10-CM | POA: Diagnosis not present

## 2020-05-23 IMAGING — MR MR HEAD W/O CM
10 series · 48 of 48 positions shown · non-contrast
Comparison: None available

CLINICAL DATA: Progression of migraine Miliam with worsening
headaches

EXAM:
MRI HEAD WITHOUT CONTRAST
TECHNIQUE: Multiplanar, multiecho pulse sequences of the brain and surrounding
structures were obtained without intravenous contrast.

[Series 2: T1 · sagittal · 5.0mm · 0.45mm/px · 3 of 26 slices shown]
[im 1/26]
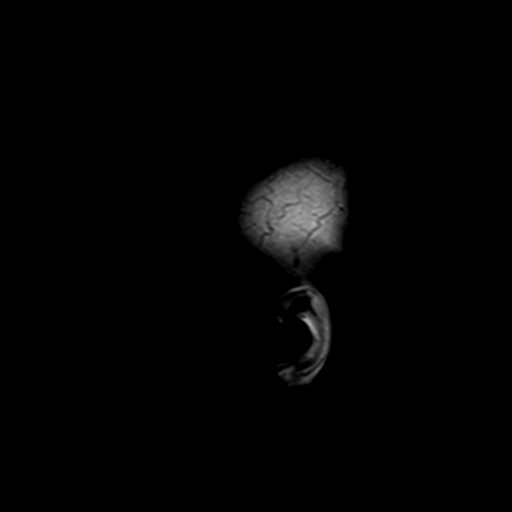
[im 13/26]
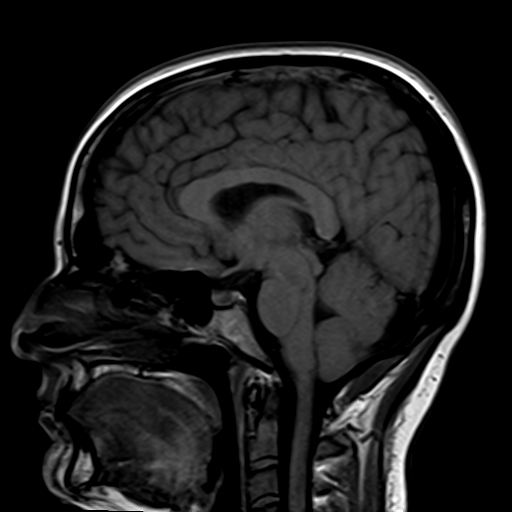
[im 26/26]
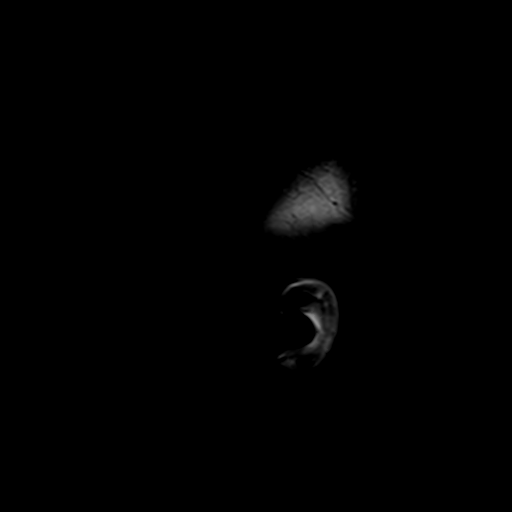

[Series 3: DWI · axial · 3.0mm · 1.80mm/px · z∈[-62,+96]mm · 9 of 108 slices shown (1 of 4)]
[im 1/108]
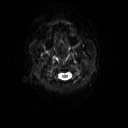
[im 14/108]
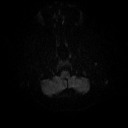
[im 27/108]
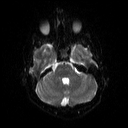
[im 41/108]
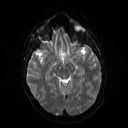
[im 54/108]
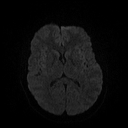
[im 67/108]
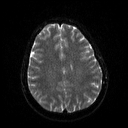
[im 81/108]
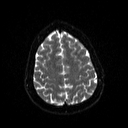
[im 94/108]
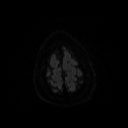
[im 108/108]
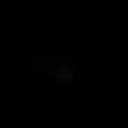

[Series 4: DWI · axial · 3.0mm · 1.80mm/px · z∈[-62,+96]mm · 4 of 54 slices shown (2 of 4)]
[im 1/54]
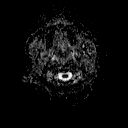
[im 18/54]
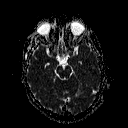
[im 36/54]
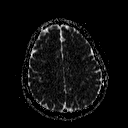
[im 54/54]
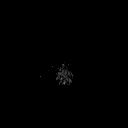

[Series 5: FLAIR · axial · 3.0mm · 0.47mm/px · z∈[-65,+92]mm · 3 of 35 slices shown]
[im 1/35]
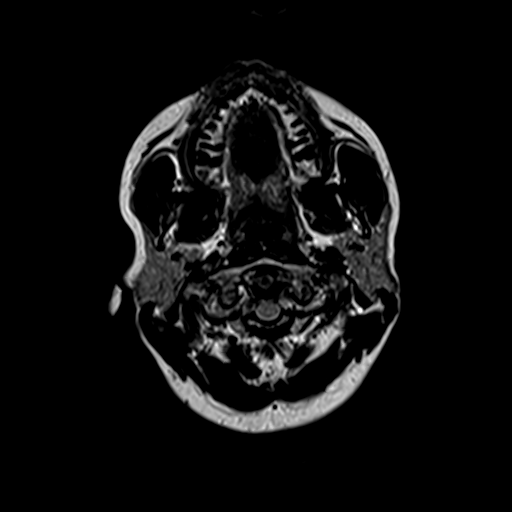
[im 18/35]
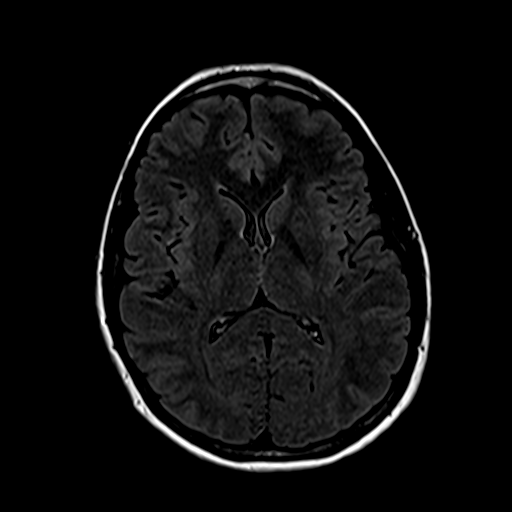
[im 35/35]
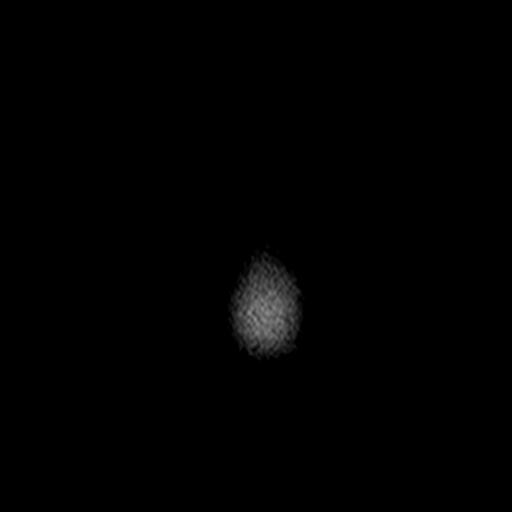

[Series 6: T2 · axial · 5.0mm · 0.60mm/px · z∈[-63,+97]mm · 2 of 25 slices shown (1 of 2)]
[im 1/25]
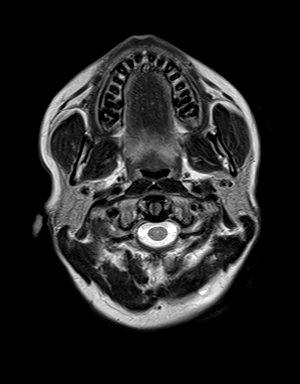
[im 25/25]
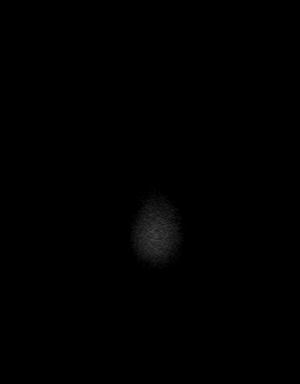

[Series 8: swi_images · axial · 5.0mm · 0.90mm/px · z∈[-71,+102]mm · 3 of 36 slices shown]
[im 1/36]
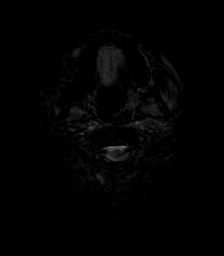
[im 18/36]
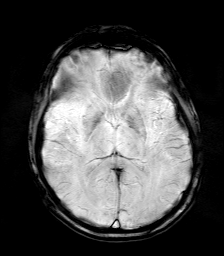
[im 36/36]
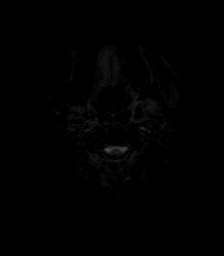

[Series 9: t1_mpr_tra · axial · 1.0mm · 0.72mm/px · z∈[-61,+96]mm · 13 of 160 slices shown]
[im 1/160]
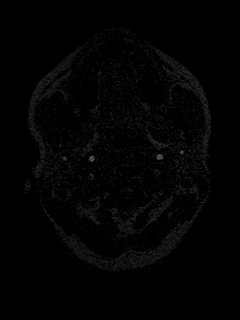
[im 14/160]
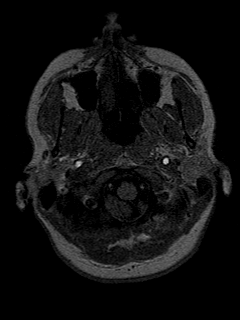
[im 27/160]
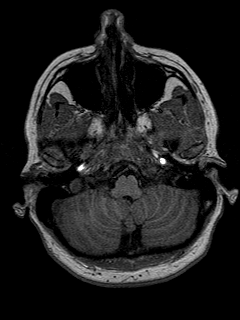
[im 40/160]
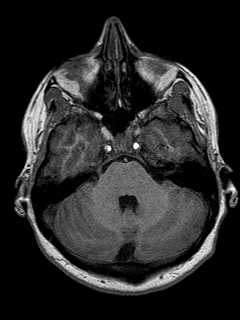
[im 54/160]
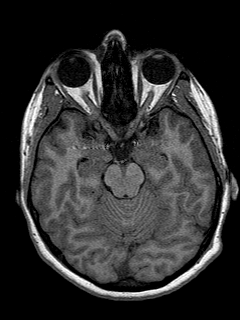
[im 67/160]
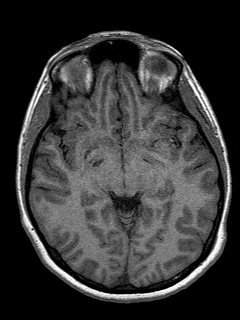
[im 80/160]
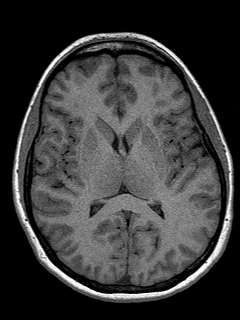
[im 93/160]
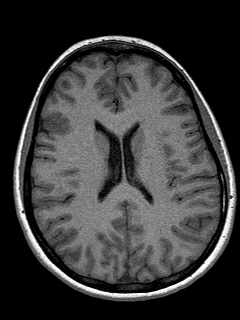
[im 107/160]
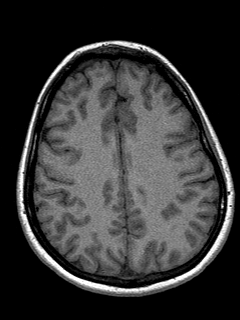
[im 120/160]
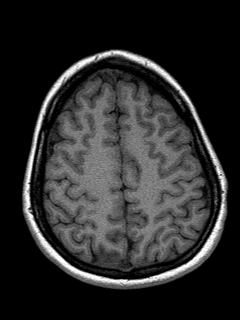
[im 133/160]
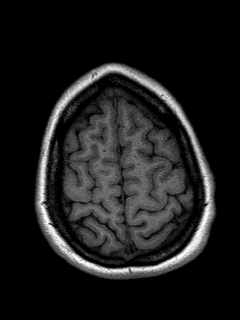
[im 146/160]
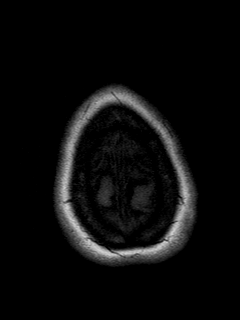
[im 160/160]
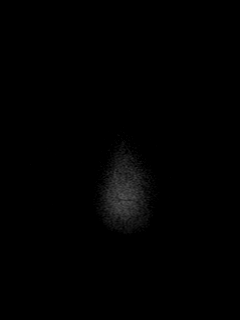

[Series 10: DWI · coronal · 5.0mm · 1.80mm/px · 6 of 76 slices shown (3 of 4)]
[im 1/76]
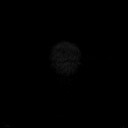
[im 16/76]
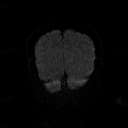
[im 31/76]
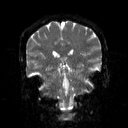
[im 46/76]
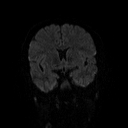
[im 61/76]
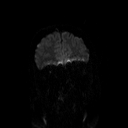
[im 76/76]
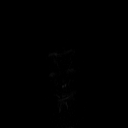

[Series 11: DWI · coronal · 5.0mm · 1.80mm/px · 3 of 39 slices shown (4 of 4)]
[im 1/39]
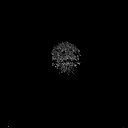
[im 20/39]
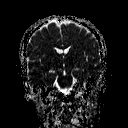
[im 39/39]
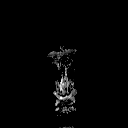

[Series 12: T2 · coronal · 5.0mm · 0.45mm/px · 2 of 30 slices shown (2 of 2)]
[im 1/30]
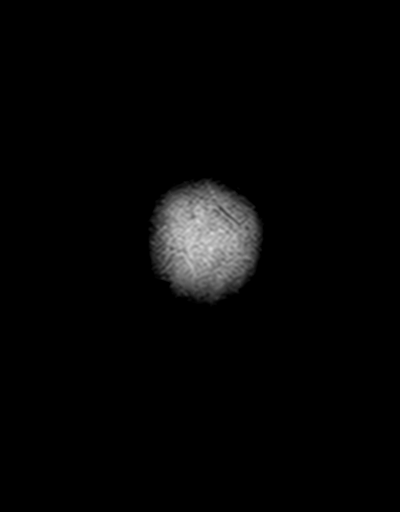
[im 30/30]
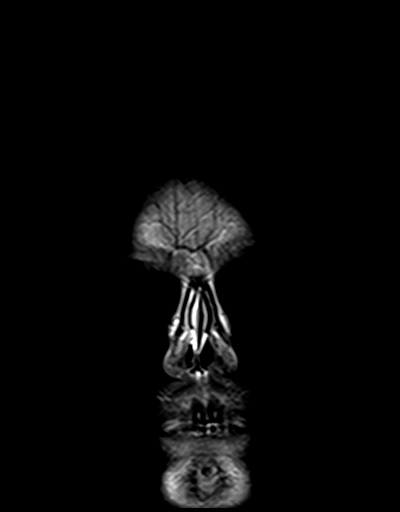

[48 of 48 positions shown; findings below may reference images not displayed]

FINDINGS: Brain: No swelling, infarction, hemorrhage, hydrocephalus,
extra-axial collection or mass lesion. No white matter disease to
implicate complicated migraine. Normal brain volume

Vascular: Normal flow voids

Skull and upper cervical spine: Normal marrow signal

Sinuses/Orbits: Negative
IMPRESSION: Normal brain MRI.

## 2020-05-28 DIAGNOSIS — F422 Mixed obsessional thoughts and acts: Secondary | ICD-10-CM | POA: Diagnosis not present

## 2020-05-28 DIAGNOSIS — Z630 Problems in relationship with spouse or partner: Secondary | ICD-10-CM | POA: Diagnosis not present

## 2020-05-29 DIAGNOSIS — R102 Pelvic and perineal pain: Secondary | ICD-10-CM | POA: Diagnosis not present

## 2020-05-29 DIAGNOSIS — N930 Postcoital and contact bleeding: Secondary | ICD-10-CM | POA: Diagnosis not present

## 2020-05-30 DIAGNOSIS — G43839 Menstrual migraine, intractable, without status migrainosus: Secondary | ICD-10-CM | POA: Diagnosis not present

## 2020-05-30 DIAGNOSIS — G43719 Chronic migraine without aura, intractable, without status migrainosus: Secondary | ICD-10-CM | POA: Diagnosis not present

## 2020-05-30 DIAGNOSIS — G43119 Migraine with aura, intractable, without status migrainosus: Secondary | ICD-10-CM | POA: Diagnosis not present

## 2020-06-11 DIAGNOSIS — Z630 Problems in relationship with spouse or partner: Secondary | ICD-10-CM | POA: Diagnosis not present

## 2020-06-11 DIAGNOSIS — F422 Mixed obsessional thoughts and acts: Secondary | ICD-10-CM | POA: Diagnosis not present

## 2020-06-19 DIAGNOSIS — L905 Scar conditions and fibrosis of skin: Secondary | ICD-10-CM | POA: Diagnosis not present

## 2020-06-19 DIAGNOSIS — L814 Other melanin hyperpigmentation: Secondary | ICD-10-CM | POA: Diagnosis not present

## 2020-06-19 DIAGNOSIS — D1801 Hemangioma of skin and subcutaneous tissue: Secondary | ICD-10-CM | POA: Diagnosis not present

## 2020-06-19 DIAGNOSIS — Z872 Personal history of diseases of the skin and subcutaneous tissue: Secondary | ICD-10-CM | POA: Diagnosis not present

## 2020-07-02 DIAGNOSIS — F422 Mixed obsessional thoughts and acts: Secondary | ICD-10-CM | POA: Diagnosis not present

## 2020-07-02 DIAGNOSIS — Z630 Problems in relationship with spouse or partner: Secondary | ICD-10-CM | POA: Diagnosis not present

## 2020-07-09 DIAGNOSIS — Z630 Problems in relationship with spouse or partner: Secondary | ICD-10-CM | POA: Diagnosis not present

## 2020-07-09 DIAGNOSIS — F422 Mixed obsessional thoughts and acts: Secondary | ICD-10-CM | POA: Diagnosis not present

## 2020-07-16 DIAGNOSIS — Z630 Problems in relationship with spouse or partner: Secondary | ICD-10-CM | POA: Diagnosis not present

## 2020-07-16 DIAGNOSIS — F422 Mixed obsessional thoughts and acts: Secondary | ICD-10-CM | POA: Diagnosis not present

## 2020-07-31 DIAGNOSIS — F422 Mixed obsessional thoughts and acts: Secondary | ICD-10-CM | POA: Diagnosis not present

## 2020-07-31 DIAGNOSIS — Z630 Problems in relationship with spouse or partner: Secondary | ICD-10-CM | POA: Diagnosis not present

## 2020-08-27 DIAGNOSIS — Z630 Problems in relationship with spouse or partner: Secondary | ICD-10-CM | POA: Diagnosis not present

## 2020-08-27 DIAGNOSIS — F422 Mixed obsessional thoughts and acts: Secondary | ICD-10-CM | POA: Diagnosis not present

## 2020-08-29 DIAGNOSIS — G43119 Migraine with aura, intractable, without status migrainosus: Secondary | ICD-10-CM | POA: Diagnosis not present

## 2020-08-29 DIAGNOSIS — G43719 Chronic migraine without aura, intractable, without status migrainosus: Secondary | ICD-10-CM | POA: Diagnosis not present

## 2020-08-29 DIAGNOSIS — G43839 Menstrual migraine, intractable, without status migrainosus: Secondary | ICD-10-CM | POA: Diagnosis not present

## 2020-09-10 DIAGNOSIS — F422 Mixed obsessional thoughts and acts: Secondary | ICD-10-CM | POA: Diagnosis not present

## 2020-09-10 DIAGNOSIS — Z630 Problems in relationship with spouse or partner: Secondary | ICD-10-CM | POA: Diagnosis not present

## 2020-09-24 DIAGNOSIS — F422 Mixed obsessional thoughts and acts: Secondary | ICD-10-CM | POA: Diagnosis not present

## 2020-09-24 DIAGNOSIS — Z630 Problems in relationship with spouse or partner: Secondary | ICD-10-CM | POA: Diagnosis not present

## 2020-10-08 DIAGNOSIS — F422 Mixed obsessional thoughts and acts: Secondary | ICD-10-CM | POA: Diagnosis not present

## 2020-10-08 DIAGNOSIS — Z630 Problems in relationship with spouse or partner: Secondary | ICD-10-CM | POA: Diagnosis not present

## 2020-10-22 DIAGNOSIS — F422 Mixed obsessional thoughts and acts: Secondary | ICD-10-CM | POA: Diagnosis not present

## 2020-10-22 DIAGNOSIS — Z630 Problems in relationship with spouse or partner: Secondary | ICD-10-CM | POA: Diagnosis not present

## 2020-11-05 DIAGNOSIS — Z630 Problems in relationship with spouse or partner: Secondary | ICD-10-CM | POA: Diagnosis not present

## 2020-11-05 DIAGNOSIS — F422 Mixed obsessional thoughts and acts: Secondary | ICD-10-CM | POA: Diagnosis not present

## 2020-12-03 DIAGNOSIS — Z630 Problems in relationship with spouse or partner: Secondary | ICD-10-CM | POA: Diagnosis not present

## 2020-12-03 DIAGNOSIS — F422 Mixed obsessional thoughts and acts: Secondary | ICD-10-CM | POA: Diagnosis not present

## 2020-12-17 DIAGNOSIS — F422 Mixed obsessional thoughts and acts: Secondary | ICD-10-CM | POA: Diagnosis not present

## 2020-12-17 DIAGNOSIS — Z630 Problems in relationship with spouse or partner: Secondary | ICD-10-CM | POA: Diagnosis not present

## 2020-12-31 DIAGNOSIS — F422 Mixed obsessional thoughts and acts: Secondary | ICD-10-CM | POA: Diagnosis not present

## 2020-12-31 DIAGNOSIS — Z630 Problems in relationship with spouse or partner: Secondary | ICD-10-CM | POA: Diagnosis not present

## 2021-01-28 DIAGNOSIS — F422 Mixed obsessional thoughts and acts: Secondary | ICD-10-CM | POA: Diagnosis not present

## 2021-01-28 DIAGNOSIS — Z630 Problems in relationship with spouse or partner: Secondary | ICD-10-CM | POA: Diagnosis not present

## 2021-02-10 DIAGNOSIS — H15111 Episcleritis periodica fugax, right eye: Secondary | ICD-10-CM | POA: Diagnosis not present

## 2021-02-18 DIAGNOSIS — Z630 Problems in relationship with spouse or partner: Secondary | ICD-10-CM | POA: Diagnosis not present

## 2021-02-18 DIAGNOSIS — F422 Mixed obsessional thoughts and acts: Secondary | ICD-10-CM | POA: Diagnosis not present

## 2021-03-26 DIAGNOSIS — G43839 Menstrual migraine, intractable, without status migrainosus: Secondary | ICD-10-CM | POA: Diagnosis not present

## 2021-03-26 DIAGNOSIS — G43719 Chronic migraine without aura, intractable, without status migrainosus: Secondary | ICD-10-CM | POA: Diagnosis not present

## 2021-03-26 DIAGNOSIS — G43909 Migraine, unspecified, not intractable, without status migrainosus: Secondary | ICD-10-CM | POA: Diagnosis not present

## 2021-03-26 DIAGNOSIS — G43119 Migraine with aura, intractable, without status migrainosus: Secondary | ICD-10-CM | POA: Diagnosis not present

## 2021-03-26 DIAGNOSIS — Z0000 Encounter for general adult medical examination without abnormal findings: Secondary | ICD-10-CM | POA: Diagnosis not present

## 2021-04-01 DIAGNOSIS — Z630 Problems in relationship with spouse or partner: Secondary | ICD-10-CM | POA: Diagnosis not present

## 2021-04-01 DIAGNOSIS — F422 Mixed obsessional thoughts and acts: Secondary | ICD-10-CM | POA: Diagnosis not present

## 2021-04-14 DIAGNOSIS — F422 Mixed obsessional thoughts and acts: Secondary | ICD-10-CM | POA: Diagnosis not present

## 2021-04-14 DIAGNOSIS — Z630 Problems in relationship with spouse or partner: Secondary | ICD-10-CM | POA: Diagnosis not present

## 2021-04-29 DIAGNOSIS — F422 Mixed obsessional thoughts and acts: Secondary | ICD-10-CM | POA: Diagnosis not present

## 2021-04-29 DIAGNOSIS — Z630 Problems in relationship with spouse or partner: Secondary | ICD-10-CM | POA: Diagnosis not present

## 2021-04-30 DIAGNOSIS — M25571 Pain in right ankle and joints of right foot: Secondary | ICD-10-CM | POA: Diagnosis not present

## 2021-05-13 DIAGNOSIS — F422 Mixed obsessional thoughts and acts: Secondary | ICD-10-CM | POA: Diagnosis not present

## 2021-05-13 DIAGNOSIS — Z630 Problems in relationship with spouse or partner: Secondary | ICD-10-CM | POA: Diagnosis not present

## 2021-05-21 DIAGNOSIS — N760 Acute vaginitis: Secondary | ICD-10-CM | POA: Diagnosis not present

## 2021-05-21 DIAGNOSIS — Z3040 Encounter for surveillance of contraceptives, unspecified: Secondary | ICD-10-CM | POA: Diagnosis not present

## 2021-05-21 DIAGNOSIS — Z01419 Encounter for gynecological examination (general) (routine) without abnormal findings: Secondary | ICD-10-CM | POA: Diagnosis not present

## 2021-05-21 DIAGNOSIS — Z6828 Body mass index (BMI) 28.0-28.9, adult: Secondary | ICD-10-CM | POA: Diagnosis not present

## 2021-05-27 DIAGNOSIS — F422 Mixed obsessional thoughts and acts: Secondary | ICD-10-CM | POA: Diagnosis not present

## 2021-05-27 DIAGNOSIS — Z630 Problems in relationship with spouse or partner: Secondary | ICD-10-CM | POA: Diagnosis not present

## 2021-06-03 DIAGNOSIS — H40053 Ocular hypertension, bilateral: Secondary | ICD-10-CM | POA: Diagnosis not present

## 2021-06-03 DIAGNOSIS — S0501XA Injury of conjunctiva and corneal abrasion without foreign body, right eye, initial encounter: Secondary | ICD-10-CM | POA: Diagnosis not present

## 2021-06-06 DIAGNOSIS — H40053 Ocular hypertension, bilateral: Secondary | ICD-10-CM | POA: Diagnosis not present

## 2021-06-06 DIAGNOSIS — F422 Mixed obsessional thoughts and acts: Secondary | ICD-10-CM | POA: Diagnosis not present

## 2021-06-06 DIAGNOSIS — Z630 Problems in relationship with spouse or partner: Secondary | ICD-10-CM | POA: Diagnosis not present

## 2021-06-18 DIAGNOSIS — Z872 Personal history of diseases of the skin and subcutaneous tissue: Secondary | ICD-10-CM | POA: Diagnosis not present

## 2021-06-18 DIAGNOSIS — L814 Other melanin hyperpigmentation: Secondary | ICD-10-CM | POA: Diagnosis not present

## 2021-06-18 DIAGNOSIS — L821 Other seborrheic keratosis: Secondary | ICD-10-CM | POA: Diagnosis not present

## 2021-06-18 DIAGNOSIS — D225 Melanocytic nevi of trunk: Secondary | ICD-10-CM | POA: Diagnosis not present

## 2021-06-25 DIAGNOSIS — F422 Mixed obsessional thoughts and acts: Secondary | ICD-10-CM | POA: Diagnosis not present

## 2021-06-25 DIAGNOSIS — Z630 Problems in relationship with spouse or partner: Secondary | ICD-10-CM | POA: Diagnosis not present

## 2021-07-07 DIAGNOSIS — Z20822 Contact with and (suspected) exposure to covid-19: Secondary | ICD-10-CM | POA: Diagnosis not present

## 2021-07-08 DIAGNOSIS — Z630 Problems in relationship with spouse or partner: Secondary | ICD-10-CM | POA: Diagnosis not present

## 2021-07-08 DIAGNOSIS — F422 Mixed obsessional thoughts and acts: Secondary | ICD-10-CM | POA: Diagnosis not present

## 2021-07-22 DIAGNOSIS — Z630 Problems in relationship with spouse or partner: Secondary | ICD-10-CM | POA: Diagnosis not present

## 2021-07-22 DIAGNOSIS — F422 Mixed obsessional thoughts and acts: Secondary | ICD-10-CM | POA: Diagnosis not present

## 2021-08-05 DIAGNOSIS — Z630 Problems in relationship with spouse or partner: Secondary | ICD-10-CM | POA: Diagnosis not present

## 2021-08-05 DIAGNOSIS — F422 Mixed obsessional thoughts and acts: Secondary | ICD-10-CM | POA: Diagnosis not present

## 2021-08-21 DIAGNOSIS — F422 Mixed obsessional thoughts and acts: Secondary | ICD-10-CM | POA: Diagnosis not present

## 2021-08-21 DIAGNOSIS — Z630 Problems in relationship with spouse or partner: Secondary | ICD-10-CM | POA: Diagnosis not present

## 2021-09-02 DIAGNOSIS — Z630 Problems in relationship with spouse or partner: Secondary | ICD-10-CM | POA: Diagnosis not present

## 2021-09-02 DIAGNOSIS — F422 Mixed obsessional thoughts and acts: Secondary | ICD-10-CM | POA: Diagnosis not present

## 2021-09-30 DIAGNOSIS — F422 Mixed obsessional thoughts and acts: Secondary | ICD-10-CM | POA: Diagnosis not present

## 2021-09-30 DIAGNOSIS — Z630 Problems in relationship with spouse or partner: Secondary | ICD-10-CM | POA: Diagnosis not present

## 2021-10-02 DIAGNOSIS — G43719 Chronic migraine without aura, intractable, without status migrainosus: Secondary | ICD-10-CM | POA: Diagnosis not present

## 2021-10-02 DIAGNOSIS — G43119 Migraine with aura, intractable, without status migrainosus: Secondary | ICD-10-CM | POA: Diagnosis not present

## 2021-10-02 DIAGNOSIS — G43839 Menstrual migraine, intractable, without status migrainosus: Secondary | ICD-10-CM | POA: Diagnosis not present

## 2021-10-14 DIAGNOSIS — Z630 Problems in relationship with spouse or partner: Secondary | ICD-10-CM | POA: Diagnosis not present

## 2021-10-14 DIAGNOSIS — F422 Mixed obsessional thoughts and acts: Secondary | ICD-10-CM | POA: Diagnosis not present

## 2021-11-03 DIAGNOSIS — R300 Dysuria: Secondary | ICD-10-CM | POA: Diagnosis not present

## 2021-11-03 DIAGNOSIS — R102 Pelvic and perineal pain: Secondary | ICD-10-CM | POA: Diagnosis not present

## 2021-11-18 DIAGNOSIS — F422 Mixed obsessional thoughts and acts: Secondary | ICD-10-CM | POA: Diagnosis not present

## 2021-11-18 DIAGNOSIS — Z630 Problems in relationship with spouse or partner: Secondary | ICD-10-CM | POA: Diagnosis not present

## 2021-12-02 DIAGNOSIS — Z630 Problems in relationship with spouse or partner: Secondary | ICD-10-CM | POA: Diagnosis not present

## 2021-12-02 DIAGNOSIS — F422 Mixed obsessional thoughts and acts: Secondary | ICD-10-CM | POA: Diagnosis not present

## 2021-12-16 DIAGNOSIS — Z630 Problems in relationship with spouse or partner: Secondary | ICD-10-CM | POA: Diagnosis not present

## 2021-12-16 DIAGNOSIS — F422 Mixed obsessional thoughts and acts: Secondary | ICD-10-CM | POA: Diagnosis not present

## 2021-12-17 DIAGNOSIS — M79671 Pain in right foot: Secondary | ICD-10-CM | POA: Diagnosis not present

## 2021-12-24 DIAGNOSIS — H938X3 Other specified disorders of ear, bilateral: Secondary | ICD-10-CM | POA: Diagnosis not present

## 2021-12-24 DIAGNOSIS — J029 Acute pharyngitis, unspecified: Secondary | ICD-10-CM | POA: Diagnosis not present

## 2021-12-27 DIAGNOSIS — M79671 Pain in right foot: Secondary | ICD-10-CM | POA: Diagnosis not present

## 2021-12-30 DIAGNOSIS — Z630 Problems in relationship with spouse or partner: Secondary | ICD-10-CM | POA: Diagnosis not present

## 2021-12-30 DIAGNOSIS — F422 Mixed obsessional thoughts and acts: Secondary | ICD-10-CM | POA: Diagnosis not present

## 2022-01-05 DIAGNOSIS — M79671 Pain in right foot: Secondary | ICD-10-CM | POA: Diagnosis not present

## 2022-01-20 DIAGNOSIS — M2011 Hallux valgus (acquired), right foot: Secondary | ICD-10-CM | POA: Diagnosis not present

## 2022-01-28 DIAGNOSIS — F422 Mixed obsessional thoughts and acts: Secondary | ICD-10-CM | POA: Diagnosis not present

## 2022-01-28 DIAGNOSIS — Z630 Problems in relationship with spouse or partner: Secondary | ICD-10-CM | POA: Diagnosis not present

## 2022-02-10 DIAGNOSIS — F422 Mixed obsessional thoughts and acts: Secondary | ICD-10-CM | POA: Diagnosis not present

## 2022-02-10 DIAGNOSIS — Z630 Problems in relationship with spouse or partner: Secondary | ICD-10-CM | POA: Diagnosis not present

## 2022-02-24 DIAGNOSIS — F422 Mixed obsessional thoughts and acts: Secondary | ICD-10-CM | POA: Diagnosis not present

## 2022-02-24 DIAGNOSIS — Z630 Problems in relationship with spouse or partner: Secondary | ICD-10-CM | POA: Diagnosis not present

## 2022-03-10 DIAGNOSIS — Z630 Problems in relationship with spouse or partner: Secondary | ICD-10-CM | POA: Diagnosis not present

## 2022-03-10 DIAGNOSIS — F422 Mixed obsessional thoughts and acts: Secondary | ICD-10-CM | POA: Diagnosis not present

## 2022-03-24 DIAGNOSIS — F422 Mixed obsessional thoughts and acts: Secondary | ICD-10-CM | POA: Diagnosis not present

## 2022-03-24 DIAGNOSIS — Z630 Problems in relationship with spouse or partner: Secondary | ICD-10-CM | POA: Diagnosis not present

## 2022-04-01 DIAGNOSIS — Z13220 Encounter for screening for lipoid disorders: Secondary | ICD-10-CM | POA: Diagnosis not present

## 2022-04-01 DIAGNOSIS — R7309 Other abnormal glucose: Secondary | ICD-10-CM | POA: Diagnosis not present

## 2022-04-01 DIAGNOSIS — Z0000 Encounter for general adult medical examination without abnormal findings: Secondary | ICD-10-CM | POA: Diagnosis not present

## 2022-04-01 DIAGNOSIS — G43909 Migraine, unspecified, not intractable, without status migrainosus: Secondary | ICD-10-CM | POA: Diagnosis not present

## 2022-04-01 DIAGNOSIS — G43719 Chronic migraine without aura, intractable, without status migrainosus: Secondary | ICD-10-CM | POA: Diagnosis not present

## 2022-04-07 DIAGNOSIS — F422 Mixed obsessional thoughts and acts: Secondary | ICD-10-CM | POA: Diagnosis not present

## 2022-04-07 DIAGNOSIS — Z630 Problems in relationship with spouse or partner: Secondary | ICD-10-CM | POA: Diagnosis not present

## 2022-04-27 DIAGNOSIS — U071 COVID-19: Secondary | ICD-10-CM | POA: Diagnosis not present

## 2022-05-05 DIAGNOSIS — F422 Mixed obsessional thoughts and acts: Secondary | ICD-10-CM | POA: Diagnosis not present

## 2022-05-05 DIAGNOSIS — Z630 Problems in relationship with spouse or partner: Secondary | ICD-10-CM | POA: Diagnosis not present

## 2022-05-19 DIAGNOSIS — Z630 Problems in relationship with spouse or partner: Secondary | ICD-10-CM | POA: Diagnosis not present

## 2022-05-19 DIAGNOSIS — F422 Mixed obsessional thoughts and acts: Secondary | ICD-10-CM | POA: Diagnosis not present

## 2022-05-28 DIAGNOSIS — Z6828 Body mass index (BMI) 28.0-28.9, adult: Secondary | ICD-10-CM | POA: Diagnosis not present

## 2022-05-28 DIAGNOSIS — Z01419 Encounter for gynecological examination (general) (routine) without abnormal findings: Secondary | ICD-10-CM | POA: Diagnosis not present

## 2022-05-28 DIAGNOSIS — Z3040 Encounter for surveillance of contraceptives, unspecified: Secondary | ICD-10-CM | POA: Diagnosis not present

## 2022-06-16 DIAGNOSIS — Z630 Problems in relationship with spouse or partner: Secondary | ICD-10-CM | POA: Diagnosis not present

## 2022-06-16 DIAGNOSIS — F422 Mixed obsessional thoughts and acts: Secondary | ICD-10-CM | POA: Diagnosis not present

## 2022-07-01 DIAGNOSIS — Z630 Problems in relationship with spouse or partner: Secondary | ICD-10-CM | POA: Diagnosis not present

## 2022-07-01 DIAGNOSIS — F422 Mixed obsessional thoughts and acts: Secondary | ICD-10-CM | POA: Diagnosis not present

## 2022-07-14 DIAGNOSIS — Z630 Problems in relationship with spouse or partner: Secondary | ICD-10-CM | POA: Diagnosis not present

## 2022-07-14 DIAGNOSIS — F422 Mixed obsessional thoughts and acts: Secondary | ICD-10-CM | POA: Diagnosis not present

## 2022-07-23 DIAGNOSIS — Z630 Problems in relationship with spouse or partner: Secondary | ICD-10-CM | POA: Diagnosis not present

## 2022-07-23 DIAGNOSIS — F422 Mixed obsessional thoughts and acts: Secondary | ICD-10-CM | POA: Diagnosis not present

## 2022-08-25 DIAGNOSIS — Z630 Problems in relationship with spouse or partner: Secondary | ICD-10-CM | POA: Diagnosis not present

## 2022-08-25 DIAGNOSIS — F422 Mixed obsessional thoughts and acts: Secondary | ICD-10-CM | POA: Diagnosis not present

## 2022-09-29 DIAGNOSIS — Z630 Problems in relationship with spouse or partner: Secondary | ICD-10-CM | POA: Diagnosis not present

## 2022-09-29 DIAGNOSIS — F422 Mixed obsessional thoughts and acts: Secondary | ICD-10-CM | POA: Diagnosis not present

## 2022-10-07 DIAGNOSIS — G43719 Chronic migraine without aura, intractable, without status migrainosus: Secondary | ICD-10-CM | POA: Diagnosis not present

## 2023-01-27 DIAGNOSIS — D225 Melanocytic nevi of trunk: Secondary | ICD-10-CM | POA: Diagnosis not present

## 2023-01-27 DIAGNOSIS — Z872 Personal history of diseases of the skin and subcutaneous tissue: Secondary | ICD-10-CM | POA: Diagnosis not present

## 2023-01-27 DIAGNOSIS — L821 Other seborrheic keratosis: Secondary | ICD-10-CM | POA: Diagnosis not present

## 2023-01-27 DIAGNOSIS — L814 Other melanin hyperpigmentation: Secondary | ICD-10-CM | POA: Diagnosis not present

## 2023-03-29 DIAGNOSIS — G43719 Chronic migraine without aura, intractable, without status migrainosus: Secondary | ICD-10-CM | POA: Diagnosis not present

## 2023-04-08 DIAGNOSIS — J309 Allergic rhinitis, unspecified: Secondary | ICD-10-CM | POA: Diagnosis not present

## 2023-04-08 DIAGNOSIS — Z0000 Encounter for general adult medical examination without abnormal findings: Secondary | ICD-10-CM | POA: Diagnosis not present

## 2023-04-08 DIAGNOSIS — K5900 Constipation, unspecified: Secondary | ICD-10-CM | POA: Diagnosis not present

## 2023-04-08 DIAGNOSIS — Z13220 Encounter for screening for lipoid disorders: Secondary | ICD-10-CM | POA: Diagnosis not present

## 2023-04-28 DIAGNOSIS — E559 Vitamin D deficiency, unspecified: Secondary | ICD-10-CM | POA: Diagnosis not present

## 2023-04-28 DIAGNOSIS — E639 Nutritional deficiency, unspecified: Secondary | ICD-10-CM | POA: Diagnosis not present

## 2023-06-08 DIAGNOSIS — K649 Unspecified hemorrhoids: Secondary | ICD-10-CM | POA: Diagnosis not present

## 2023-06-08 DIAGNOSIS — Z01419 Encounter for gynecological examination (general) (routine) without abnormal findings: Secondary | ICD-10-CM | POA: Diagnosis not present

## 2023-06-08 DIAGNOSIS — K5909 Other constipation: Secondary | ICD-10-CM | POA: Diagnosis not present

## 2023-06-08 DIAGNOSIS — Z1330 Encounter for screening examination for mental health and behavioral disorders, unspecified: Secondary | ICD-10-CM | POA: Diagnosis not present

## 2023-07-15 DIAGNOSIS — R140 Abdominal distension (gaseous): Secondary | ICD-10-CM | POA: Diagnosis not present

## 2023-07-15 DIAGNOSIS — K648 Other hemorrhoids: Secondary | ICD-10-CM | POA: Diagnosis not present

## 2023-07-15 DIAGNOSIS — K5904 Chronic idiopathic constipation: Secondary | ICD-10-CM | POA: Diagnosis not present

## 2023-08-31 NOTE — Progress Notes (Unsigned)
New Patient Note  RE: Jennifer Gamble MRN: [id number] DOB: 1986/02/28 Date of Office Visit: 09/01/2023  Consult requested by: No ref. provider found Primary care provider: Pcp, No  Chief Complaint: No chief complaint on file.  History of Present Illness: I had the pleasure of seeing Jennifer Gamble for initial evaluation at the [facility] on 08/31/2023. She is a 38 y.o. female, who is referred here by Pcp, No for the evaluation of ***.  Discussed the use of AI scribe software for clinical note transcription with the patient, who gave verbal consent to proceed.  History of Present Illness             ***  Assessment and Plan: Jennifer Gamble is a 38 y.o. female with: ***  Assessment and Plan               No follow-ups on file.  No orders of the defined types were placed in this encounter.  Lab Orders  No laboratory test(s) ordered today    Other allergy screening: Asthma: {Blank single:19197::"yes","no"} Rhino conjunctivitis: {Blank single:19197::"yes","no"} Food allergy: {Blank single:19197::"yes","no"} Medication allergy: {Blank single:19197::"yes","no"} Hymenoptera allergy: {Blank single:19197::"yes","no"} Urticaria: {Blank single:19197::"yes","no"} Eczema:{Blank single:19197::"yes","no"} History of recurrent infections suggestive of immunodeficency: {Blank single:19197::"yes","no"}  Diagnostics: Spirometry:  Tracings reviewed. Her effort: {Blank single:19197::"Good reproducible efforts.","It was hard to get consistent efforts and there is a question as to whether this reflects a maximal maneuver.","Poor effort, data can not be interpreted."} FVC: ***L FEV1: ***L, ***% predicted FEV1/FVC ratio: ***% Interpretation: {Blank single:19197::"Spirometry consistent with mild obstructive disease","Spirometry consistent with moderate obstructive disease","Spirometry consistent with severe obstructive disease","Spirometry consistent with possible  restrictive disease","Spirometry consistent with mixed obstructive and restrictive disease","Spirometry uninterpretable due to technique","Spirometry consistent with normal pattern","No overt abnormalities noted given today's efforts"}.  Please see scanned spirometry results for details.  Skin Testing: {Blank single:19197::"Select foods","Environmental allergy panel","Environmental allergy panel and select foods","Food allergy panel","None","Deferred due to recent antihistamines use"}. *** Results discussed with patient/family.   Past Medical History: There are no active problems to display for this patient.  Past Medical History:  Diagnosis Date  . Abnormal Pap smear    CIN II  . Acid reflux   . Depression   . Polycystic ovarian disease   . Ulcer    Past Surgical History: Past Surgical History:  Procedure Laterality Date  . GYNECOLOGIC CRYOSURGERY    . INCISION AND DRAINAGE OF PERITONSILLAR ABCESS N/A 03/12/2019   Procedure: CONTROL OF OROPHARYNGEAL BLEEDING;  Surgeon: Newman Pies, MD;  Location: [facility] OR;  Service: ENT;  Laterality: N/A;  . TONSILLECTOMY AND ADENOIDECTOMY Bilateral 03/03/2019   Procedure: TONSILLECTOMY AND ADENOIDECTOMY;  Surgeon: Newman Pies, MD;  Location: [facility];  Service: ENT;  Laterality: Bilateral;  . WISDOM TOOTH EXTRACTION     Medication List:  Current Outpatient Medications  Medication Sig Dispense Refill  . ibuprofen (ADVIL,MOTRIN) 200 MG tablet Take 400 mg by mouth 2 (two) times daily as needed. Cramping.     . methocarbamol (ROBAXIN) 500 MG tablet Take 500 mg by mouth 4 (four) times daily.     No current facility-administered medications for this visit.   Allergies: No Known Allergies Social History: Social History   Socioeconomic History  . Marital status: Married    Spouse name: Not on file  . Number of children: Not on file  . Years of education: Not on file  . Highest education level: Not on file  Occupational History  . Not  on file  Tobacco Use  . Smoking status: Never  . Smokeless tobacco: Never  Vaping Use  . Vaping status: Never Used  Substance and Sexual Activity  . Alcohol use: Yes    Alcohol/week: 2.0 standard drinks of alcohol    Types: 2 Standard drinks or equivalent per week  . Drug use: No  . Sexual activity: Yes    Birth control/protection: Condom  Other Topics Concern  . Not on file  Social History Narrative  . Not on file   Social Drivers of Health   Financial Resource Strain: Not on file  Food Insecurity: Not on file  Transportation Needs: Not on file  Physical Activity: Not on file  Stress: Not on file  Social Connections: Not on file   Lives in a ***. Smoking: *** Occupation: ***  Environmental HistorySurveyor, minerals in the house: Copywriter, advertising in the family room: {Blank single:19197::"yes","no"} Carpet in the bedroom: {Blank single:19197::"yes","no"} Heating: {Blank single:19197::"electric","gas","heat pump"} Cooling: {Blank single:19197::"central","window","heat pump"} Pet: {Blank single:19197::"yes ***","no"}  Family History: Family History  Problem Relation Age of Onset  . Diabetes Mother   . Hypertension Mother    Problem                               Relation Asthma                                   *** Eczema                                *** Food allergy                          *** Allergic rhino conjunctivitis     ***  Review of Systems  Constitutional:  Negative for appetite change, chills, fever and unexpected weight change.  HENT:  Negative for congestion and rhinorrhea.   Eyes:  Negative for itching.  Respiratory:  Negative for cough, chest tightness, shortness of breath and wheezing.   Cardiovascular:  Negative for chest pain.  Gastrointestinal:  Negative for abdominal pain.  Genitourinary:  Negative for difficulty urinating.  Skin:  Negative for rash.  Neurological:  Negative for headaches.   Objective: There  were no vitals taken for this visit. There is no height or weight on file to calculate BMI. Physical Exam Vitals and nursing note reviewed.  Constitutional:      Appearance: Normal appearance. She is well-developed.  HENT:     Head: Normocephalic and atraumatic.     Right Ear: Tympanic membrane and external ear normal.     Left Ear: Tympanic membrane and external ear normal.     Nose: Nose normal.     Mouth/Throat:     Mouth: Mucous membranes are moist.     Pharynx: Oropharynx is clear.  Eyes:     Conjunctiva/sclera: Conjunctivae normal.  Cardiovascular:     Rate and Rhythm: Normal rate and regular rhythm.     Heart sounds: Normal heart sounds. No murmur heard.    No friction rub. No gallop.  Pulmonary:     Effort: Pulmonary effort is normal.     Breath sounds: Normal breath sounds. No wheezing, rhonchi or rales.  Musculoskeletal:     Cervical back: Neck supple.  Skin:  General: Skin is warm.     Findings: No rash.  Neurological:     Mental Status: She is alert and oriented to person, place, and time.  Psychiatric:        Behavior: Behavior normal.  The plan was reviewed with the patient/family, and all questions/concerned were addressed.  It was my pleasure to see Mackinze today and participate in her care. Please feel free to contact me with any questions or concerns.  Sincerely,  Wyline Mood, DO Allergy & Immunology  [facility] office: [phone] [facility] office: [phone]

## 2023-09-01 ENCOUNTER — Other Ambulatory Visit: Payer: Self-pay

## 2023-09-01 ENCOUNTER — Encounter: Payer: Self-pay | Admitting: Allergy

## 2023-09-01 ENCOUNTER — Ambulatory Visit (INDEPENDENT_AMBULATORY_CARE_PROVIDER_SITE_OTHER): Payer: BC Managed Care – PPO | Admitting: Allergy

## 2023-09-01 VITALS — BP 110/82 | HR 79 | Temp 98.3°F | Resp 19 | Ht 62.75 in | Wt 141.2 lb

## 2023-09-01 DIAGNOSIS — Z8709 Personal history of other diseases of the respiratory system: Secondary | ICD-10-CM | POA: Diagnosis not present

## 2023-09-01 DIAGNOSIS — H1013 Acute atopic conjunctivitis, bilateral: Secondary | ICD-10-CM | POA: Diagnosis not present

## 2023-09-01 DIAGNOSIS — J3089 Other allergic rhinitis: Secondary | ICD-10-CM | POA: Diagnosis not present

## 2023-09-01 NOTE — Patient Instructions (Addendum)
Rhinitis  Return for allergy skin testing. Will make additional recommendations based on results. If significant positives will recommend allergy injections. If negative will refer to ENT next.  Make sure you don't take any antihistamines for 3 days before the skin testing appointment. Don't put any lotion on the back and arms on the day of testing.  Plan on being here for 30-60 minutes.   Use saline gel for dry nares. Use humidifier at night during the winter months.  No Flonase.

## 2023-09-08 ENCOUNTER — Ambulatory Visit (INDEPENDENT_AMBULATORY_CARE_PROVIDER_SITE_OTHER): Payer: BC Managed Care – PPO | Admitting: Allergy

## 2023-09-08 ENCOUNTER — Encounter: Payer: Self-pay | Admitting: Allergy

## 2023-09-08 DIAGNOSIS — J3089 Other allergic rhinitis: Secondary | ICD-10-CM | POA: Diagnosis not present

## 2023-09-08 DIAGNOSIS — H1013 Acute atopic conjunctivitis, bilateral: Secondary | ICD-10-CM

## 2023-09-08 NOTE — Progress Notes (Signed)
 Skin testing note  RE: TRENESE HAFT MRN: [id number] DOB: 1986-04-02 Date of Office Visit: 09/08/2023  Referring provider: No ref. provider found Primary care provider: Kip Righter, MD  Chief Complaint: skin testing  History of Present Illness: I had the pleasure of seeing Jennifer Gamble for a skin testing visit at the [facility] on 09/08/2023. She is a 38 y.o. female, who is being followed for allergic rhino conjunctivitis. Her previous allergy  office visit was on 09/01/2023 with Dr. Luke. Today is a skin testing visit.   No worsening symptoms since off antihistamines.   Assessment and Plan: Jennifer Gamble is a 38 y.o. female with: Other allergic rhinitis Allergic conjunctivitis of both eyes Past history - persistent clear rhinorrhea, postnasal drip, and itchy eyes. Symptoms have worsened over the past year and are now year-round. No significant relief with Allegra and Flonase. 2 cats, 1 dog at home. Works in a therapist, sports.  Today's skin prick testing negative to indoor/outdoor allergens.  Get bloodwork instead of intradermal testing due to limited space on upper extremities from her tattoos.  If positive will recommend allergy  immunotherapy. If negative will refer to ENT next. Consider adding on azelastine or Atrovent  nasal spray depending on test results.  Use saline gel for dry nares. Use humidifier at night during the winter months.  No Flonase.  May use refresh eye drops as needed.   Return if symptoms worsen or fail to improve.  No orders of the defined types were placed in this encounter.  Lab Orders         Allergens w/Total IgE Area 2     Diagnostics: Skin Testing: Environmental allergy  panel. Today's skin prick testing negative to indoor/outdoor allergens.  Results discussed with patient/family.  Airborne Adult Perc - 09/08/23 1337     Time Antigen Placed 1337    Allergen Manufacturer Jestine    Location Back    Number of Test 55    1.  Control-Buffer 50% Glycerol Negative    2. Control-Histamine 3+    3. Bahia Negative    4. Bermuda Negative    5. Johnson Negative    6. Kentucky  Blue Negative    7. Meadow Fescue Negative    8. Perennial Rye Negative    9. Timothy Negative    10. Ragweed Mix Negative    11. Cocklebur Negative    12. Plantain,  English Negative    13. Baccharis Negative    14. Dog Fennel Negative    15. Russian Thistle Negative    16. Lamb's Quarters Negative    17. Sheep Sorrell Negative    18. Rough Pigweed Negative    19. Marsh Elder, Rough Negative    20. Mugwort, Common Negative    21. Box, Elder Negative    22. Cedar, red Negative    23. Sweet Gum Negative    24. Pecan Pollen Negative    25. Pine Mix Negative    26. Walnut, Black Pollen Negative    27. Red Mulberry Negative    28. Ash Mix Negative    29. Birch Mix Negative    30. Beech American Negative    31. Cottonwood, Eastern Negative    32. Hickory, White Negative    33. Maple Mix Negative    34. Oak, Eastern Mix Negative    35. Sycamore Eastern Negative    36. Alternaria Alternata Negative    37. Cladosporium Herbarum Negative    38. Aspergillus Mix Negative  39. Penicillium Mix Negative    40. Bipolaris Sorokiniana (Helminthosporium) Negative    41. Drechslera Spicifera (Curvularia) Negative    42. Mucor Plumbeus Negative    43. Fusarium Moniliforme Negative    44. Aureobasidium Pullulans (pullulara) Negative    45. Rhizopus Oryzae Negative    46. Botrytis Cinera Negative    47. Epicoccum Nigrum Negative    48. Phoma Betae Negative    49. Dust Mite Mix Negative    50. Cat Hair 10,000 BAU/ml Negative    51.  Dog Epithelia Negative    52. Mixed Feathers Negative    53. Horse Epithelia Negative    54. Cockroach, German Negative    55. Tobacco Leaf Negative             Previous notes and tests were reviewed. The plan was reviewed with the patient/family, and all questions/concerned were addressed.  It was my  pleasure to see Jennifer Gamble today and participate in her care. Please feel free to contact me with any questions or concerns.  Sincerely,  Orlan Cramp, DO Allergy  & Immunology  [facility] [place]  [place] office: [phone] [facility] office: [phone]

## 2023-09-08 NOTE — Patient Instructions (Addendum)
 Today's skin testing negative to indoor/outdoor allergens.   Results given.  Rhinitis Get bloodwork instead of intradermal testing If negative will refer to ENT next. Use saline gel for dry nares. Use humidifier at night during the winter months.  No Flonase.  May use refresh eye drops as needed.   Get bloodwork We are ordering labs, so please allow 1-2 weeks for the results to come back. With the newly implemented Cures Act, the labs might be visible to you at the same time that they become visible to me. However, I will not address the results until all of the results are back, so please be patient.  In the meantime, continue recommendations in your patient instructions, including avoidance measures (if applicable), until you hear from me.  Follow up depending on bloodwork results.

## 2023-09-11 LAB — ALLERGENS W/TOTAL IGE AREA 2

## 2023-09-12 ENCOUNTER — Encounter: Payer: Self-pay | Admitting: Allergy

## 2023-09-12 ENCOUNTER — Telehealth: Payer: Self-pay | Admitting: Allergy

## 2023-09-12 MED ORDER — IPRATROPIUM BROMIDE 0.03 % NA SOLN: 1.0000 | Freq: Two times a day (BID) | NASAL | 2 refills | Status: DC | PRN

## 2023-09-12 NOTE — Telephone Encounter (Signed)
 Please place referral to ENT for chronic non-allergic rhinitis. Negative allergy  testing.

## 2023-10-05 DIAGNOSIS — J029 Acute pharyngitis, unspecified: Secondary | ICD-10-CM | POA: Diagnosis not present

## 2023-10-05 DIAGNOSIS — Z20822 Contact with and (suspected) exposure to covid-19: Secondary | ICD-10-CM | POA: Diagnosis not present

## 2023-10-05 DIAGNOSIS — R059 Cough, unspecified: Secondary | ICD-10-CM | POA: Diagnosis not present

## 2023-11-03 DIAGNOSIS — G43719 Chronic migraine without aura, intractable, without status migrainosus: Secondary | ICD-10-CM | POA: Diagnosis not present

## 2023-12-10 ENCOUNTER — Other Ambulatory Visit: Payer: Self-pay | Admitting: Allergy

## 2023-12-14 ENCOUNTER — Other Ambulatory Visit: Payer: Self-pay | Admitting: Gastroenterology

## 2023-12-14 DIAGNOSIS — R1011 Right upper quadrant pain: Secondary | ICD-10-CM

## 2023-12-14 DIAGNOSIS — R1013 Epigastric pain: Secondary | ICD-10-CM | POA: Diagnosis not present

## 2023-12-14 DIAGNOSIS — R110 Nausea: Secondary | ICD-10-CM | POA: Diagnosis not present

## 2023-12-14 DIAGNOSIS — K219 Gastro-esophageal reflux disease without esophagitis: Secondary | ICD-10-CM | POA: Diagnosis not present

## 2023-12-14 DIAGNOSIS — K5904 Chronic idiopathic constipation: Secondary | ICD-10-CM | POA: Diagnosis not present

## 2023-12-15 DIAGNOSIS — K259 Gastric ulcer, unspecified as acute or chronic, without hemorrhage or perforation: Secondary | ICD-10-CM | POA: Diagnosis not present

## 2023-12-15 DIAGNOSIS — K219 Gastro-esophageal reflux disease without esophagitis: Secondary | ICD-10-CM | POA: Diagnosis not present

## 2023-12-15 DIAGNOSIS — R1013 Epigastric pain: Secondary | ICD-10-CM | POA: Diagnosis not present

## 2023-12-17 ENCOUNTER — Ambulatory Visit
Admission: RE | Admit: 2023-12-17 | Discharge: 2023-12-17 | Disposition: A | Discharge status: Home / Self Care | Source: Ambulatory Visit | Attending: Gastroenterology | Admitting: Gastroenterology

## 2023-12-17 DIAGNOSIS — R1011 Right upper quadrant pain: Secondary | ICD-10-CM

## 2024-01-04 DIAGNOSIS — K5904 Chronic idiopathic constipation: Secondary | ICD-10-CM | POA: Diagnosis not present

## 2024-01-04 DIAGNOSIS — K219 Gastro-esophageal reflux disease without esophagitis: Secondary | ICD-10-CM | POA: Diagnosis not present

## 2024-01-04 DIAGNOSIS — K259 Gastric ulcer, unspecified as acute or chronic, without hemorrhage or perforation: Secondary | ICD-10-CM | POA: Diagnosis not present

## 2024-01-04 DIAGNOSIS — R1013 Epigastric pain: Secondary | ICD-10-CM | POA: Diagnosis not present

## 2024-01-12 ENCOUNTER — Encounter (INDEPENDENT_AMBULATORY_CARE_PROVIDER_SITE_OTHER): Payer: Self-pay | Admitting: Otolaryngology

## 2024-01-12 ENCOUNTER — Ambulatory Visit (INDEPENDENT_AMBULATORY_CARE_PROVIDER_SITE_OTHER): Admitting: Otolaryngology

## 2024-01-12 VITALS — BP 114/79 | HR 74 | Ht 65.0 in | Wt 139.0 lb

## 2024-01-12 DIAGNOSIS — R0981 Nasal congestion: Secondary | ICD-10-CM | POA: Diagnosis not present

## 2024-01-12 DIAGNOSIS — J310 Chronic rhinitis: Secondary | ICD-10-CM | POA: Diagnosis not present

## 2024-01-12 DIAGNOSIS — J342 Deviated nasal septum: Secondary | ICD-10-CM

## 2024-01-12 DIAGNOSIS — H9211 Otorrhea, right ear: Secondary | ICD-10-CM | POA: Diagnosis not present

## 2024-01-12 DIAGNOSIS — R0982 Postnasal drip: Secondary | ICD-10-CM | POA: Diagnosis not present

## 2024-01-12 DIAGNOSIS — J343 Hypertrophy of nasal turbinates: Secondary | ICD-10-CM | POA: Diagnosis not present

## 2024-01-14 DIAGNOSIS — J310 Chronic rhinitis: Secondary | ICD-10-CM | POA: Insufficient documentation

## 2024-01-14 DIAGNOSIS — R0982 Postnasal drip: Secondary | ICD-10-CM | POA: Insufficient documentation

## 2024-01-14 DIAGNOSIS — J343 Hypertrophy of nasal turbinates: Secondary | ICD-10-CM | POA: Insufficient documentation

## 2024-01-14 DIAGNOSIS — J342 Deviated nasal septum: Secondary | ICD-10-CM | POA: Insufficient documentation

## 2024-01-14 NOTE — Progress Notes (Signed)
 CC: Chronic nasal drainage  HPI:  Jennifer Gamble is a 38 y.o. female who presents today complaining of chronic nasal drainage for the past 2 years.  Her rhinorrhea is year-round.  She was evaluated by an allergist.  Her allergy  testing was negative.  She was treated with Atrovent  without significant improvement in her symptoms.  She denies any recent sinusitis.  She underwent adenotonsillectomy surgery in 2020.  She has no previous nasal surgery.  Past Medical History:  Diagnosis Date   Abnormal Pap smear    CIN II   Acid reflux    Depression    Polycystic ovarian disease    Ulcer     Past Surgical History:  Procedure Laterality Date   ADENOIDECTOMY     GYNECOLOGIC CRYOSURGERY     INCISION AND DRAINAGE OF PERITONSILLAR ABCESS N/A 03/12/2019   Procedure: CONTROL OF OROPHARYNGEAL BLEEDING;  Surgeon: Reynold Caves, MD;  Location: [facility] OR;  Service: ENT;  Laterality: N/A;   TONSILLECTOMY     TONSILLECTOMY AND ADENOIDECTOMY Bilateral 03/03/2019   Procedure: TONSILLECTOMY AND ADENOIDECTOMY;  Surgeon: Reynold Caves, MD;  Location:  SURGERY CENTER;  Service: ENT;  Laterality: Bilateral;   TYMPANOSTOMY TUBE PLACEMENT     WISDOM TOOTH EXTRACTION      Family History  Problem Relation Age of Onset   Asthma Mother    Allergic rhinitis Mother    Diabetes Mother    Hypertension Mother    Asthma Brother    Urticaria Son    Eczema Son     Social History:  reports that she has never smoked. She has never used smokeless tobacco. She reports current alcohol use of about 2.0 standard drinks of alcohol per week. She reports that she does not use drugs.  Allergies: No Known Allergies  Prior to Admission medications   Medication Sig Start Date End Date Taking? Authorizing Provider  UBRELVY 100 MG TABS TAKE 1 TABLET BY MOUTH AS NEEDED FOR MIGRAINE. MAY REPEAT ONCE AFTER 2 HOURS   Yes [provider]  Vitamin D, Ergocalciferol, (DRISDOL) 1.25 MG (50000 UNIT) CAPS capsule Take 50,000  Units by mouth once a week. 06/07/23  Yes [provider]  zonisamide (ZONEGRAN) 50 MG capsule Take 3 capsules by mouth daily.   Yes [provider]  fexofenadine (ALLEGRA ODT) 30 MG disintegrating tablet Take 30 mg by mouth daily. Patient not taking: Reported on 01/12/2024    [provider]  ipratropium (ATROVENT ) 0.03 % nasal spray PLACE 1-2 SPRAYS INTO BOTH NOSTRILS 2 (TWO) TIMES DAILY AS NEEDED (NASAL DRAINAGE). Patient not taking: Reported on 01/12/2024 12/10/23   Rochester Chuck, MD    Blood pressure 114/79, pulse 74, height 5' 5 (1.651 m), weight 139 lb (63 kg), SpO2 98%. Exam: General: Communicates without difficulty, well nourished, no acute distress. Head: Normocephalic, no evidence injury, no tenderness, facial buttresses intact without stepoff. Face/sinus: No tenderness to palpation and percussion. Facial movement is normal and symmetric. Eyes: PERRL, EOMI. No scleral icterus, conjunctivae clear. Neuro: CN II exam reveals vision grossly intact.  No nystagmus at any point of gaze. Ears: Auricles well formed without lesions.  Ear canals are intact without mass or lesion.  No erythema or edema is appreciated.  The TMs are intact without fluid. Nose: External evaluation reveals normal support and skin without lesions.  Dorsum is intact.  Anterior rhinoscopy reveals congested mucosa over anterior aspect of inferior turbinates and intact septum.  No purulence noted. Oral:  Oral cavity and  oropharynx are intact, symmetric, without erythema or edema.  Mucosa is moist without lesions. Neck: Full range of motion without pain.  There is no significant lymphadenopathy.  No masses palpable.  Thyroid  bed within normal limits to palpation.  Parotid glands and submandibular glands equal bilaterally without mass.  Trachea is midline. Neuro:  CN 2-12 grossly intact.   Procedure:  Flexible Nasal Endoscopy: Description: Risks, benefits, and alternatives of flexible endoscopy were  explained to the patient.  Specific mention was made of the risk of throat numbness with difficulty swallowing, possible bleeding from the nose and mouth, and pain from the procedure.  The patient gave oral consent to proceed.  The flexible scope was inserted into the right nasal cavity.  Endoscopy of the interior nasal cavity, superior, inferior, and middle meatus was performed. The sphenoid-ethmoid recess was examined. Edematous mucosa was noted.  No polyp, mass, or lesion was appreciated. Nasal septal deviation noted. Olfactory cleft was clear.  Nasopharynx was clear.  Turbinates were hypertrophied but without mass.  The procedure was repeated on the contralateral side with similar findings.  The patient tolerated the procedure well.   Assessment: 1.  Chronic/vasomotor rhinitis, with nasal mucosal congestion, nasal septal deviation, and bilateral inferior turbinate hypertrophy. 2.  Chronic right otorrhea and postnasal drainage.  Plan: 1.  The physical exam and nasal endoscopy findings are reviewed with the patient. 2.  Continue with Atrovent  nasal spray to treat the nasal drainage. 3.  Nasal saline irrigation is encouraged.   4.  If the patient continues to be symptomatic, she may benefit from referral to a rhinologist for possible cryoablation therapy.  Shanetra Blumenstock W Maliyah Willets 01/14/2024, 3:07 PM

## 2024-02-02 DIAGNOSIS — D225 Melanocytic nevi of trunk: Secondary | ICD-10-CM | POA: Diagnosis not present

## 2024-02-02 DIAGNOSIS — L821 Other seborrheic keratosis: Secondary | ICD-10-CM | POA: Diagnosis not present

## 2024-02-02 DIAGNOSIS — L814 Other melanin hyperpigmentation: Secondary | ICD-10-CM | POA: Diagnosis not present

## 2024-02-02 DIAGNOSIS — L819 Disorder of pigmentation, unspecified: Secondary | ICD-10-CM | POA: Diagnosis not present

## 2024-03-08 ENCOUNTER — Other Ambulatory Visit: Payer: Self-pay | Admitting: Allergy & Immunology

## 2024-04-06 ENCOUNTER — Other Ambulatory Visit: Payer: Self-pay | Admitting: Obstetrics and Gynecology

## 2024-04-06 DIAGNOSIS — Z1231 Encounter for screening mammogram for malignant neoplasm of breast: Secondary | ICD-10-CM

## 2024-04-12 DIAGNOSIS — H04123 Dry eye syndrome of bilateral lacrimal glands: Secondary | ICD-10-CM | POA: Diagnosis not present

## 2024-04-12 DIAGNOSIS — H5213 Myopia, bilateral: Secondary | ICD-10-CM | POA: Diagnosis not present

## 2024-04-12 DIAGNOSIS — H52223 Regular astigmatism, bilateral: Secondary | ICD-10-CM | POA: Diagnosis not present

## 2024-04-12 DIAGNOSIS — H40053 Ocular hypertension, bilateral: Secondary | ICD-10-CM | POA: Diagnosis not present

## 2024-04-24 DIAGNOSIS — K6289 Other specified diseases of anus and rectum: Secondary | ICD-10-CM | POA: Diagnosis not present

## 2024-04-24 DIAGNOSIS — K602 Anal fissure, unspecified: Secondary | ICD-10-CM | POA: Diagnosis not present

## 2024-04-24 DIAGNOSIS — K5904 Chronic idiopathic constipation: Secondary | ICD-10-CM | POA: Diagnosis not present

## 2024-05-03 ENCOUNTER — Ambulatory Visit
Admission: RE | Admit: 2024-05-03 | Discharge: 2024-05-03 | Disposition: A | Discharge status: Home / Self Care | Source: Ambulatory Visit | Attending: Obstetrics and Gynecology | Admitting: Obstetrics and Gynecology

## 2024-05-03 DIAGNOSIS — Z1231 Encounter for screening mammogram for malignant neoplasm of breast: Secondary | ICD-10-CM | POA: Diagnosis not present

## 2024-06-07 DIAGNOSIS — L819 Disorder of pigmentation, unspecified: Secondary | ICD-10-CM | POA: Diagnosis not present

## 2024-07-10 DIAGNOSIS — L603 Nail dystrophy: Secondary | ICD-10-CM | POA: Diagnosis not present

## 2024-07-12 DIAGNOSIS — M545 Low back pain, unspecified: Secondary | ICD-10-CM | POA: Diagnosis not present

## 2024-07-24 DIAGNOSIS — Z01419 Encounter for gynecological examination (general) (routine) without abnormal findings: Secondary | ICD-10-CM | POA: Diagnosis not present

## 2024-07-28 DIAGNOSIS — J029 Acute pharyngitis, unspecified: Secondary | ICD-10-CM | POA: Diagnosis not present

## 2024-07-28 DIAGNOSIS — Z20822 Contact with and (suspected) exposure to covid-19: Secondary | ICD-10-CM | POA: Diagnosis not present
